# Patient Record
Sex: Female | Born: 1980 | Race: Black or African American | Hispanic: No | Marital: Single | Smoking: Never smoker
Health system: Southern US, Community
[De-identification: ages and names within clinical notes are randomized; demographics above are authoritative.]

## PROBLEM LIST (undated history)

## (undated) DIAGNOSIS — G43909 Migraine, unspecified, not intractable, without status migrainosus: Secondary | ICD-10-CM

## (undated) DIAGNOSIS — J302 Other seasonal allergic rhinitis: Secondary | ICD-10-CM

## (undated) DIAGNOSIS — B029 Zoster without complications: Secondary | ICD-10-CM

## (undated) HISTORY — PX: TUBAL LIGATION: SHX77

---

## 2005-05-10 ENCOUNTER — Emergency Department (HOSPITAL_COMMUNITY): Admission: EM | Admit: 2005-05-10 | Discharge: 2005-05-10 | Payer: Self-pay | Admitting: Emergency Medicine

## 2005-11-02 ENCOUNTER — Emergency Department (HOSPITAL_COMMUNITY): Admission: EM | Admit: 2005-11-02 | Discharge: 2005-11-02 | Payer: Self-pay | Admitting: Family Medicine

## 2006-01-06 ENCOUNTER — Emergency Department (HOSPITAL_COMMUNITY): Admission: EM | Admit: 2006-01-06 | Discharge: 2006-01-06 | Payer: Self-pay | Admitting: Family Medicine

## 2007-04-22 ENCOUNTER — Emergency Department (HOSPITAL_COMMUNITY): Admission: EM | Admit: 2007-04-22 | Discharge: 2007-04-22 | Payer: Self-pay | Admitting: Emergency Medicine

## 2007-05-03 ENCOUNTER — Emergency Department (HOSPITAL_COMMUNITY): Admission: EM | Admit: 2007-05-03 | Discharge: 2007-05-03 | Payer: Self-pay | Admitting: *Deleted

## 2007-05-14 ENCOUNTER — Emergency Department (HOSPITAL_COMMUNITY): Admission: EM | Admit: 2007-05-14 | Discharge: 2007-05-15 | Payer: Self-pay | Admitting: Emergency Medicine

## 2007-05-20 ENCOUNTER — Inpatient Hospital Stay (HOSPITAL_COMMUNITY): Admission: AD | Admit: 2007-05-20 | Discharge: 2007-05-20 | Payer: Self-pay | Admitting: Gynecology

## 2007-05-23 ENCOUNTER — Inpatient Hospital Stay (HOSPITAL_COMMUNITY): Admission: AD | Admit: 2007-05-23 | Discharge: 2007-05-23 | Payer: Self-pay | Admitting: Gynecology

## 2007-05-27 ENCOUNTER — Inpatient Hospital Stay (HOSPITAL_COMMUNITY): Admission: AD | Admit: 2007-05-27 | Discharge: 2007-05-27 | Payer: Self-pay | Admitting: Gynecology

## 2007-06-06 ENCOUNTER — Observation Stay (HOSPITAL_COMMUNITY): Admission: AD | Admit: 2007-06-06 | Discharge: 2007-06-07 | Payer: Self-pay | Admitting: Obstetrics and Gynecology

## 2007-06-06 ENCOUNTER — Ambulatory Visit: Payer: Self-pay | Admitting: Obstetrics and Gynecology

## 2007-06-24 ENCOUNTER — Ambulatory Visit (HOSPITAL_COMMUNITY): Admission: RE | Admit: 2007-06-24 | Discharge: 2007-06-24 | Payer: Self-pay | Admitting: Family Medicine

## 2007-07-01 ENCOUNTER — Ambulatory Visit: Payer: Self-pay | Admitting: Family Medicine

## 2007-07-15 ENCOUNTER — Ambulatory Visit: Payer: Self-pay | Admitting: Obstetrics and Gynecology

## 2007-07-28 ENCOUNTER — Ambulatory Visit: Payer: Self-pay | Admitting: Obstetrics & Gynecology

## 2007-07-28 ENCOUNTER — Ambulatory Visit (HOSPITAL_COMMUNITY): Admission: RE | Admit: 2007-07-28 | Discharge: 2007-07-28 | Payer: Self-pay | Admitting: Obstetrics and Gynecology

## 2007-08-20 ENCOUNTER — Ambulatory Visit: Payer: Self-pay | Admitting: Obstetrics & Gynecology

## 2007-08-20 ENCOUNTER — Inpatient Hospital Stay (HOSPITAL_COMMUNITY): Admission: AD | Admit: 2007-08-20 | Discharge: 2007-08-20 | Payer: Self-pay | Admitting: Obstetrics & Gynecology

## 2007-08-25 ENCOUNTER — Ambulatory Visit: Payer: Self-pay | Admitting: *Deleted

## 2007-09-06 ENCOUNTER — Inpatient Hospital Stay (HOSPITAL_COMMUNITY): Admission: AD | Admit: 2007-09-06 | Discharge: 2007-09-06 | Payer: Self-pay | Admitting: Obstetrics & Gynecology

## 2007-09-06 ENCOUNTER — Ambulatory Visit: Payer: Self-pay | Admitting: Obstetrics and Gynecology

## 2007-09-15 ENCOUNTER — Ambulatory Visit: Payer: Self-pay | Admitting: Obstetrics & Gynecology

## 2007-09-29 ENCOUNTER — Ambulatory Visit: Payer: Self-pay | Admitting: *Deleted

## 2007-09-30 ENCOUNTER — Ambulatory Visit (HOSPITAL_COMMUNITY): Admission: RE | Admit: 2007-09-30 | Discharge: 2007-09-30 | Payer: Self-pay | Admitting: Family Medicine

## 2007-10-13 ENCOUNTER — Ambulatory Visit: Payer: Self-pay | Admitting: Obstetrics & Gynecology

## 2007-10-26 ENCOUNTER — Ambulatory Visit: Payer: Self-pay | Admitting: *Deleted

## 2007-10-26 ENCOUNTER — Inpatient Hospital Stay (HOSPITAL_COMMUNITY): Admission: AD | Admit: 2007-10-26 | Discharge: 2007-10-26 | Payer: Self-pay | Admitting: Obstetrics & Gynecology

## 2007-10-27 ENCOUNTER — Ambulatory Visit: Payer: Self-pay | Admitting: *Deleted

## 2007-11-03 ENCOUNTER — Ambulatory Visit: Payer: Self-pay | Admitting: Obstetrics & Gynecology

## 2007-11-08 ENCOUNTER — Ambulatory Visit: Payer: Self-pay | Admitting: Obstetrics & Gynecology

## 2007-11-08 ENCOUNTER — Inpatient Hospital Stay (HOSPITAL_COMMUNITY): Admission: AD | Admit: 2007-11-08 | Discharge: 2007-11-08 | Payer: Self-pay | Admitting: Obstetrics & Gynecology

## 2007-11-10 ENCOUNTER — Ambulatory Visit: Payer: Self-pay | Admitting: *Deleted

## 2007-11-14 ENCOUNTER — Ambulatory Visit: Payer: Self-pay | Admitting: *Deleted

## 2007-11-14 ENCOUNTER — Inpatient Hospital Stay (HOSPITAL_COMMUNITY): Admission: AD | Admit: 2007-11-14 | Discharge: 2007-11-14 | Payer: Self-pay | Admitting: Gynecology

## 2007-11-17 ENCOUNTER — Ambulatory Visit: Payer: Self-pay | Admitting: Obstetrics & Gynecology

## 2007-11-17 ENCOUNTER — Other Ambulatory Visit: Payer: Self-pay | Admitting: Obstetrics & Gynecology

## 2007-11-20 ENCOUNTER — Ambulatory Visit: Payer: Self-pay | Admitting: Obstetrics and Gynecology

## 2007-11-20 ENCOUNTER — Inpatient Hospital Stay (HOSPITAL_COMMUNITY): Admission: AD | Admit: 2007-11-20 | Discharge: 2007-11-20 | Payer: Self-pay | Admitting: Obstetrics and Gynecology

## 2007-11-22 ENCOUNTER — Ambulatory Visit: Payer: Self-pay | Admitting: Obstetrics & Gynecology

## 2007-11-24 ENCOUNTER — Inpatient Hospital Stay (HOSPITAL_COMMUNITY): Admission: AD | Admit: 2007-11-24 | Discharge: 2007-11-24 | Payer: Self-pay | Admitting: Obstetrics and Gynecology

## 2007-11-26 ENCOUNTER — Ambulatory Visit: Payer: Self-pay | Admitting: Obstetrics and Gynecology

## 2007-11-26 ENCOUNTER — Inpatient Hospital Stay (HOSPITAL_COMMUNITY): Admission: AD | Admit: 2007-11-26 | Discharge: 2007-11-28 | Payer: Self-pay | Admitting: Obstetrics and Gynecology

## 2007-11-27 ENCOUNTER — Encounter (INDEPENDENT_AMBULATORY_CARE_PROVIDER_SITE_OTHER): Payer: Self-pay | Admitting: Gynecology

## 2009-01-14 ENCOUNTER — Emergency Department (HOSPITAL_COMMUNITY): Admission: EM | Admit: 2009-01-14 | Discharge: 2009-01-14 | Payer: Self-pay | Admitting: Emergency Medicine

## 2009-04-15 ENCOUNTER — Emergency Department (HOSPITAL_COMMUNITY): Admission: EM | Admit: 2009-04-15 | Discharge: 2009-04-15 | Payer: Self-pay | Admitting: Emergency Medicine

## 2011-03-04 LAB — URINALYSIS, ROUTINE W REFLEX MICROSCOPIC: Urobilinogen, UA: 1 mg/dL (ref 0.0–1.0)

## 2011-03-04 LAB — POCT PREGNANCY, URINE: Preg Test, Ur: NEGATIVE

## 2011-04-08 NOTE — H&P (Signed)
NAME:  Patricia [name], RIDER NO.:  [id number]   MEDICAL RECORD NO.:  [id number]          PATIENT TYPE:  OBV   LOCATION:  9309                          FACILITY:  [facility]   PHYSICIAN:  Tilda Burrow, M.D. DATE OF BIRTH:  [birth year]   DATE OF ADMISSION:  06/06/2007  DATE OF DISCHARGE:                              HISTORY & PHYSICAL   CHIEF COMPLAINT:  Hyperemesis gravidarum, 15+ pound weight loss this  pregnancy.  Mild dehydration, less than 5%, pregnancy 13 weeks.   ADMITTING HISTORY:  This 30 year old female, gravida 1, para 0 is seen  in the MAU for the 6th time this pregnancy for hyperemesis symptoms.  See _Em-Stat charting_ for details sent.  Essentially she is in here for  dehydration and vomiting again and our plan is to keep her overnight,  vigorously hydrate her with fluids through the night and to coordinate  with home care to see if we can give IV fluid hydration at home in order  to avoid these recurrent visits.   REVIEW OF SYSTEMS:  Significant for the weight is now down to 110 from  126 at the onset of pregnancy.   Our plan is to admit and weigh daily, add Robinul to her regimen for the  excess salivation, Zofran for the nausea, and fluid hydration and low  residue diet with Reglan and Robinul to try to control hyperemesis.  Follow up will be as an outpatient after triage with home health in a.m.      Tilda Burrow, M.D.  Electronically Signed     JVF/MEDQ  D:  06/07/2007  T:  06/07/2007  Job:  [id number]

## 2011-04-08 NOTE — Discharge Summary (Signed)
[name], [name] NO.:  [id number]   MEDICAL RECORD NO.:  [id number]          PATIENT TYPE:  OBV   LOCATION:  9309                          FACILITY:  [facility]   PHYSICIAN:  Tilda Burrow, M.D. DATE OF BIRTH:  [birth year]   DATE OF ADMISSION:  06/06/2007  DATE OF DISCHARGE:  06/07/2007                               DISCHARGE SUMMARY   ADMISSION DIAGNOSES:  1. Hyperemesis gravidarum.  2. Pregnancy 13 weeks' gestation.  3. Mild dehydration, less than 5%.   DISCHARGE DIAGNOSES:  1. Pregnancy, 13 weeks, not delivered.  2. Hyperemesis gravidarum.  3. Dehydration, resolved.   DISCHARGE MEDICATIONS:  1. Zofran 8 mg p.o. q.12h. p.r.n. nausea.  2. Reglan 10 mg p.o. a.c.  3. Robinul 2 mg daily for salivation.   Low-residue diet.   FOLLOW-UP:  Home health.   HOSPITAL SUMMARY:  This 30 year old primiparous female was admitted for  fluid hydration after the sixth evaluation in this pregnancy for  hyperemesis symptoms.  See MAU notes.  The plan was that she was  admitted on the 13th for fluid hydration.   HOSPITAL COURSE:  The patient was admitted and received vigorous fluid  hydration and antiemetics, Robinul to reduce salivation, and was kept  overnight for fluid hydration and received a couple of units of fluid.  She was then subsequently connected to home health, who is going to give  her a liter of lactated Ringer's at 100 mL/hr. And Zofran 12 mg added to  bag every other day with Hep-Lock cuff.  The patient was to have this  therapy for approximately 2 weeks and with discontinuation of the IV  therapy thereafter.  Follow-up will subsequently be through the health  department for subsequent prenatal care.      Tilda Burrow, M.D.  Electronically Signed    JVF/MEDQ  D:  07/20/2007  T:  07/21/2007  Job:  [id number]   cc:   [facility] Department

## 2011-04-08 NOTE — Op Note (Signed)
NAME:  ROSALEA, Patricia [name] NO.:  [id number]   MEDICAL RECORD NO.:  [id number]           PATIENT TYPE:   LOCATION:                                 FACILITY:   PHYSICIAN:  Ginger Carne, MD  DATE OF BIRTH:  [birth year]   DATE OF PROCEDURE:  11/27/2007  DATE OF DISCHARGE:                               OPERATIVE REPORT   PREOPERATIVE DIAGNOSIS:  Postpartum sterilization.   POSTOPERATIVE DIAGNOSIS:  Postpartum sterilization.   PROCEDURE:  Pomeroy bilateral tubal ligation, postpartum.   SURGEON:  Ginger Carne, M.D.   ASSISTANT:  None.   COMPLICATIONS:  None immediate.   ESTIMATED BLOOD LOSS:  Minimal.   SPECIMEN:  Portions of right and left tubes to Pathology.   OPERATIVE FINDINGS:  Uterus, tubes and ovaries showed normal decidual  changes of pregnancy.  Both tubes were identified from their isthmus to  fimbriated ends separate and apart from their respective round ligaments  on either side.   OPERATIVE PROCEDURE:  The patient was prepped and draped in the usual  fashion and placed in the supine position.  Betadine solution was used  for antiseptic, and the patient voided immediately prior to the OR.  A  vertical infraumbilical incision was made and the abdomen opened.  Either tube was grasped at the isthmus/ampullary junction.  Three cm of  tube were incorporated between 2-0 plain catgut suture twice.  The tubes  were severed above said knots.  The specimens sent separately to  Pathology.  Tips cauterized.  No active bleeding noted.  Zero Vicryl  running for the fascia and 4-0 Monocryl for a subcuticular closures.  Instrument and sponge count were correct.  The patient tolerated the  procedure well and returned to the postanesthesia recovery room in  excellent condition.      Ginger Carne, MD  Electronically Signed     SHB/MEDQ  D:  11/27/2007  T:  11/27/2007  Job:  [id number]

## 2011-08-14 LAB — CBC
HCT: 32.2 — ABNORMAL LOW
HCT: 33.5 — ABNORMAL LOW
MCHC: 34.7
MCV: 90.8
MCV: 92
Platelets: 169
RDW: 12.8

## 2011-08-29 LAB — POCT URINALYSIS DIP (DEVICE)
Protein, ur: 30 — AB
Protein, ur: NEGATIVE
Specific Gravity, Urine: 1.01
Urobilinogen, UA: 1
Urobilinogen, UA: 2 — ABNORMAL HIGH

## 2011-08-29 LAB — URINALYSIS, ROUTINE W REFLEX MICROSCOPIC
Bilirubin Urine: NEGATIVE
Bilirubin Urine: NEGATIVE
Bilirubin Urine: NEGATIVE
Glucose, UA: NEGATIVE
Glucose, UA: NEGATIVE
Glucose, UA: NEGATIVE
Hgb urine dipstick: NEGATIVE
Hgb urine dipstick: NEGATIVE
Hgb urine dipstick: NEGATIVE
Ketones, ur: NEGATIVE
Ketones, ur: NEGATIVE
Ketones, ur: NEGATIVE
Nitrite: NEGATIVE
Protein, ur: NEGATIVE
Protein, ur: NEGATIVE
Specific Gravity, Urine: <1.005 — ABNORMAL LOW
Urobilinogen, UA: 0.2
Urobilinogen, UA: 1
Urobilinogen, UA: 2 — ABNORMAL HIGH
Urobilinogen, UA: 2 — ABNORMAL HIGH
pH: 6.5
pH: 7

## 2011-08-29 LAB — URINE MICROSCOPIC-ADD ON

## 2011-09-01 LAB — POCT URINALYSIS DIP (DEVICE)
Glucose, UA: NEGATIVE
Hgb urine dipstick: NEGATIVE
Ketones, ur: NEGATIVE
Nitrite: NEGATIVE
Operator id: 159681
Protein, ur: NEGATIVE
Protein, ur: NEGATIVE
Specific Gravity, Urine: 1.02
Urobilinogen, UA: 0.2
pH: 7

## 2011-09-01 LAB — FETAL FIBRONECTIN: Fetal Fibronectin: NEGATIVE

## 2011-09-01 LAB — URINALYSIS, ROUTINE W REFLEX MICROSCOPIC
Hgb urine dipstick: NEGATIVE
Ketones, ur: NEGATIVE
Nitrite: NEGATIVE
Protein, ur: NEGATIVE

## 2011-09-01 LAB — WET PREP, GENITAL
Trich, Wet Prep: NONE SEEN
Yeast Wet Prep HPF POC: NONE SEEN

## 2011-09-02 LAB — POCT URINALYSIS DIP (DEVICE)
Glucose, UA: NEGATIVE
Hgb urine dipstick: NEGATIVE
Hgb urine dipstick: NEGATIVE
Ketones, ur: NEGATIVE
Nitrite: NEGATIVE
Protein, ur: NEGATIVE
Specific Gravity, Urine: 1.015
Specific Gravity, Urine: 1.015
Urobilinogen, UA: 0.2

## 2011-09-03 LAB — POCT URINALYSIS DIP (DEVICE)
Hgb urine dipstick: NEGATIVE
Ketones, ur: NEGATIVE
Protein, ur: NEGATIVE
Specific Gravity, Urine: 1.01
Urobilinogen, UA: 1

## 2011-09-04 LAB — POCT URINALYSIS DIP (DEVICE)
Glucose, UA: NEGATIVE
Hgb urine dipstick: NEGATIVE
Ketones, ur: NEGATIVE
Operator id: 120861
Protein, ur: NEGATIVE
Specific Gravity, Urine: 1.02
Urobilinogen, UA: >=8

## 2011-09-04 LAB — URINALYSIS, ROUTINE W REFLEX MICROSCOPIC
Bilirubin Urine: NEGATIVE
Glucose, UA: NEGATIVE
Glucose, UA: NEGATIVE
Hgb urine dipstick: NEGATIVE
Hgb urine dipstick: NEGATIVE
Ketones, ur: NEGATIVE
Protein, ur: NEGATIVE
Protein, ur: NEGATIVE
Urobilinogen, UA: 1
pH: 7

## 2011-09-04 LAB — GC/CHLAMYDIA PROBE AMP, GENITAL: GC Probe Amp, Genital: NEGATIVE

## 2011-09-04 LAB — WET PREP, GENITAL
Trich, Wet Prep: NONE SEEN
Yeast Wet Prep HPF POC: NONE SEEN

## 2011-09-05 LAB — POCT URINALYSIS DIP (DEVICE)
Glucose, UA: NEGATIVE
Nitrite: NEGATIVE
Operator id: 297281
Specific Gravity, Urine: 1.025
Urobilinogen, UA: 1

## 2011-09-08 LAB — POCT URINALYSIS DIP (DEVICE)
Glucose, UA: NEGATIVE
Nitrite: NEGATIVE
Operator id: 120861
Protein, ur: NEGATIVE
Urobilinogen, UA: 2 — ABNORMAL HIGH

## 2011-09-09 LAB — URINALYSIS, ROUTINE W REFLEX MICROSCOPIC
Glucose, UA: NEGATIVE
Glucose, UA: NEGATIVE
Hgb urine dipstick: NEGATIVE
Ketones, ur: >80 — AB
Nitrite: NEGATIVE
Protein, ur: NEGATIVE
Protein, ur: NEGATIVE
Urobilinogen, UA: 1

## 2011-09-09 LAB — CBC
HCT: 41.8
MCHC: 33.7
MCV: 89.4
Platelets: 266
WBC: 6.1

## 2011-09-09 LAB — COMPREHENSIVE METABOLIC PANEL
AST: 18
Albumin: 3.9
Alkaline Phosphatase: 35 — ABNORMAL LOW
BUN: 7
Chloride: 101
Potassium: 3.2 — ABNORMAL LOW
Total Bilirubin: 1

## 2011-09-09 LAB — SAMPLE TO BLOOD BANK

## 2011-09-09 LAB — POCT PREGNANCY, URINE: Preg Test, Ur: POSITIVE

## 2011-09-10 LAB — URINE MICROSCOPIC-ADD ON

## 2011-09-10 LAB — URINALYSIS, ROUTINE W REFLEX MICROSCOPIC
Bilirubin Urine: NEGATIVE
Glucose, UA: NEGATIVE
Glucose, UA: NEGATIVE
Glucose, UA: NEGATIVE
Ketones, ur: >80 — AB
Ketones, ur: >80 — AB
Leukocytes, UA: NEGATIVE
Nitrite: NEGATIVE
Nitrite: NEGATIVE
Protein, ur: 30 — AB
Specific Gravity, Urine: >1.03 — ABNORMAL HIGH
pH: 6
pH: 6
pH: 6.5

## 2011-09-10 LAB — URINE CULTURE
Colony Count: >=100000
Colony Count: >=100000

## 2011-09-10 LAB — CBC
HCT: 35.6 — ABNORMAL LOW
MCHC: 33.8
Platelets: 229
RDW: 13

## 2011-09-10 LAB — I-STAT 8, (EC8 V) (CONVERTED LAB)
BUN: 9
Chloride: 105
pCO2, Ven: 39.8 — ABNORMAL LOW
pH, Ven: 7.359 — ABNORMAL HIGH

## 2011-09-11 LAB — I-STAT 8, (EC8 V) (CONVERTED LAB)
BUN: 6
Bicarbonate: 22
Glucose, Bld: 84
Hemoglobin: 14.6
Operator id: 285841
Sodium: 136
TCO2: 23
pCO2, Ven: 36.5 — ABNORMAL LOW

## 2011-09-11 LAB — CBC
Hemoglobin: 13.6
MCHC: 33.4
Platelets: 271
RDW: 12.3

## 2011-09-11 LAB — POCT I-STAT CREATININE: Operator id: 285841

## 2011-09-11 LAB — DIFFERENTIAL
Basophils Absolute: 0
Basophils Relative: 1
Eosinophils Relative: 1
Lymphocytes Relative: 30
Monocytes Absolute: 0.6
Neutro Abs: 3

## 2011-09-11 LAB — URINE CULTURE: Colony Count: >=100000

## 2011-09-11 LAB — URINALYSIS, ROUTINE W REFLEX MICROSCOPIC
Hgb urine dipstick: NEGATIVE
Ketones, ur: >80 — AB
Nitrite: POSITIVE — AB
Protein, ur: 30 — AB
Urobilinogen, UA: 1

## 2011-09-21 ENCOUNTER — Emergency Department (HOSPITAL_COMMUNITY)
Admission: EM | Admit: 2011-09-21 | Discharge: 2011-09-21 | Disposition: A | Discharge status: Home / Self Care | Payer: Self-pay | Attending: Emergency Medicine | Admitting: Emergency Medicine

## 2011-09-21 ENCOUNTER — Emergency Department (HOSPITAL_COMMUNITY): Payer: Self-pay

## 2011-09-21 DIAGNOSIS — F17200 Nicotine dependence, unspecified, uncomplicated: Secondary | ICD-10-CM | POA: Insufficient documentation

## 2011-09-21 DIAGNOSIS — S6390XA Sprain of unspecified part of unspecified wrist and hand, initial encounter: Secondary | ICD-10-CM | POA: Insufficient documentation

## 2011-09-21 DIAGNOSIS — Y998 Other external cause status: Secondary | ICD-10-CM | POA: Insufficient documentation

## 2011-09-21 DIAGNOSIS — Y92009 Unspecified place in unspecified non-institutional (private) residence as the place of occurrence of the external cause: Secondary | ICD-10-CM | POA: Insufficient documentation

## 2012-03-17 ENCOUNTER — Encounter (HOSPITAL_COMMUNITY): Payer: Self-pay | Admitting: Emergency Medicine

## 2012-03-17 ENCOUNTER — Emergency Department (HOSPITAL_COMMUNITY)
Admission: EM | Admit: 2012-03-17 | Discharge: 2012-03-17 | Disposition: A | Discharge status: Home / Self Care | Payer: Self-pay | Attending: Emergency Medicine | Admitting: Emergency Medicine

## 2012-03-17 DIAGNOSIS — J309 Allergic rhinitis, unspecified: Secondary | ICD-10-CM | POA: Insufficient documentation

## 2012-03-17 DIAGNOSIS — R112 Nausea with vomiting, unspecified: Secondary | ICD-10-CM | POA: Insufficient documentation

## 2012-03-17 DIAGNOSIS — J302 Other seasonal allergic rhinitis: Secondary | ICD-10-CM

## 2012-03-17 DIAGNOSIS — E860 Dehydration: Secondary | ICD-10-CM | POA: Insufficient documentation

## 2012-03-17 DIAGNOSIS — I10 Essential (primary) hypertension: Secondary | ICD-10-CM | POA: Insufficient documentation

## 2012-03-17 LAB — POCT I-STAT, CHEM 8
BUN: <3 mg/dL — ABNORMAL LOW (ref 6–23)
Calcium, Ion: 1.25 mmol/L (ref 1.12–1.32)
Chloride: 103 mEq/L (ref 96–112)
Creatinine, Ser: 0.8 mg/dL (ref 0.50–1.10)
Glucose, Bld: 106 mg/dL — ABNORMAL HIGH (ref 70–99)
Potassium: 4 mEq/L (ref 3.5–5.1)

## 2012-03-17 MED ORDER — SODIUM CHLORIDE 0.9 % IV SOLN
INTRAVENOUS | Status: DC
Start: 2012-03-17 — End: 2012-03-17

## 2012-03-17 MED ORDER — ONDANSETRON HCL 4 MG/2ML IJ SOLN
4.0000 mg | Freq: Once | INTRAMUSCULAR | Status: AC
  Administered 2012-03-17: 4 mg via INTRAVENOUS
  Filled 2012-03-17: qty 2

## 2012-03-17 MED ORDER — PROMETHAZINE HCL 25 MG RE SUPP: 25.0000 mg | Freq: Four times a day (QID) | RECTAL | Status: DC | PRN

## 2012-03-17 MED ORDER — SODIUM CHLORIDE 0.9 % IV BOLUS (SEPSIS)
1500.0000 mL | INTRAVENOUS | Status: AC
  Administered 2012-03-17: 1500 mL via INTRAVENOUS

## 2012-03-17 NOTE — Discharge Instructions (Signed)
Drink plenty of fluids (clear liquids) the next 12-24 hours then start the BRAT diet. . Use the phenergan for nausea or vomiting. Take clariting or zyrtec OTC for your runny nose and eyes. Recheck if you get worse again or you get a fever.

## 2012-03-17 NOTE — ED Provider Notes (Signed)
History     CSN: [id number]  Arrival date & time 03/17/12  0255   First MD Initiated Contact with Patient 03/17/12 [phone]      Chief Complaint  Patient presents with  . 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 She states that she has to cough up and down up and the environmental temperature is 69. She states on the 22nd she started having rhinorrhea, sneezing, watering of her eyes, and tingling in her throat. She started having nausea and vomiting yesterday and having cold chills and needles in her throat. She denies cough or diarrhea. She states she's vomited about 6 times.  PCP Dr. Elmer Picker  History reviewed. No pertinent past medical history.  History reviewed. No pertinent past surgical history.  Family History  Problem Relation Age of Onset  . Hypertension Other     History  Substance Use Topics  . Smoking status: Never Smoker   . Smokeless tobacco: Not on file  . Alcohol Use: occassional wine  employed  OB History    Grav Para Term Preterm Abortions TAB SAB Ect Mult Living                  Review of Systems  All other systems reviewed and are negative.    Allergies  Hydrocodone  Home Medications  None   BP 122/82  Pulse 110  Temp(Src) 100.5 F (38.1 C) (Oral)  Resp 20  SpO2 100%  LMP 03/08/2012  Vital signs normal except tachycardia, low grade temperature   Physical Exam  Nursing note and vitals reviewed. Constitutional: She is oriented to person, place, and time. She appears well-developed and well-nourished.  Non-toxic appearance. She does not appear ill. No distress.  HENT:  Head: Normocephalic and atraumatic.  Right Ear: External ear normal.  Left Ear: External ear normal.  Nose: Nose normal. No mucosal edema or rhinorrhea.  Mouth/Throat: Oropharynx is clear and moist and mucous  membranes are normal. No dental abscesses or uvula swelling.       Patient has clear rhinorrhea  Eyes: Conjunctivae and EOM are normal. Pupils are equal, round, and reactive to light.       Clear tearing noted  Neck: Normal range of motion and full passive range of motion without pain. Neck supple.  Cardiovascular: Normal rate, regular rhythm and normal heart sounds.  Exam reveals no gallop and no friction rub.   No murmur heard. Pulmonary/Chest: Effort normal and breath sounds normal. No respiratory distress. She has no wheezes. She has no rhonchi. She has no rales. She exhibits no tenderness and no crepitus.  Abdominal: Soft. Normal appearance and bowel sounds are normal. She exhibits no distension. There is no tenderness. There is no rebound and no guarding.  Musculoskeletal: Normal range of motion. She exhibits no edema and no tenderness.       Moves all extremities well.   Neurological: She is alert and oriented to person, place, and time. She has normal strength. No cranial nerve deficit.  Skin: Skin is warm, dry and intact. No rash noted. No erythema. No pallor.  Psychiatric: She has a normal mood and affect. Her speech is normal and behavior is normal. Her mood appears not anxious.    ED Course  Procedures (including critical care time)    Medications  0.9 %  sodium chloride infusion (  Intravenous Rate/Dose Change 03/17/12 0606)  promethazine (  PHENERGAN) 25 MG suppository (not administered)  sodium chloride 0.9 % bolus 1,500 mL (1500 mL Intravenous Given 03/17/12 0431)  ondansetron (ZOFRAN) injection 4 mg (4 mg Intravenous Given 03/17/12 0431)   Pt had one episode of vomiting after IV started.  Recheck at 06:50 and was able to drink fluids and feels better.   Results for orders placed during the hospital encounter of 03/17/12  POCT I-STAT, CHEM 8      Component Value Range   Sodium 138  135 - 145 (mEq/L)   Potassium 4.0  3.5 - 5.1 (mEq/L)   Chloride 103  96 - 112 (mEq/L)    BUN <3 (*) 6 - 23 (mg/dL)   Creatinine, Ser 1.61  0.50 - 1.10 (mg/dL)   Glucose, Bld 096 (*) 70 - 99 (mg/dL)   Calcium, Ion 0.45  4.09 - 1.32 (mmol/L)   TCO2 23  0 - 100 (mmol/L)   Hemoglobin 15.0  12.0 - 15.0 (g/dL)   HCT 81.1  91.4 - 78.2 (%)   Laboratory interpretation all normal except      1. Nausea and vomiting   2. Dehydration   3. Seasonal allergies     New Prescriptions   PROMETHAZINE (PHENERGAN) 25 MG SUPPOSITORY    Place 1 suppository (25 mg total) rectally every 6 (six) hours as needed for nausea.   Plan discharge  Devoria Albe, MD, FACEP   MDM          Ward Givens, MD 03/17/12 [phone]

## 2012-03-17 NOTE — ED Notes (Signed)
Pt states she has been having chills and runny nose since yesterday  Pt states she started vomiting today along with body aches  Pt states whenever she tries to drink anything it comes back up

## 2013-11-24 ENCOUNTER — Emergency Department (HOSPITAL_BASED_OUTPATIENT_CLINIC_OR_DEPARTMENT_OTHER): Payer: Medicaid Other

## 2013-11-24 ENCOUNTER — Emergency Department (HOSPITAL_BASED_OUTPATIENT_CLINIC_OR_DEPARTMENT_OTHER)
Admission: EM | Admit: 2013-11-24 | Discharge: 2013-11-24 | Disposition: A | Discharge status: Home / Self Care | Payer: Medicaid Other | Attending: Emergency Medicine | Admitting: Emergency Medicine

## 2013-11-24 ENCOUNTER — Encounter (HOSPITAL_BASED_OUTPATIENT_CLINIC_OR_DEPARTMENT_OTHER): Payer: Self-pay | Admitting: Emergency Medicine

## 2013-11-24 DIAGNOSIS — M545 Low back pain, unspecified: Secondary | ICD-10-CM | POA: Insufficient documentation

## 2013-11-24 DIAGNOSIS — M549 Dorsalgia, unspecified: Secondary | ICD-10-CM

## 2013-11-24 DIAGNOSIS — Z3202 Encounter for pregnancy test, result negative: Secondary | ICD-10-CM | POA: Insufficient documentation

## 2013-11-24 LAB — URINALYSIS, ROUTINE W REFLEX MICROSCOPIC
Bilirubin Urine: NEGATIVE
Glucose, UA: NEGATIVE mg/dL
Ketones, ur: NEGATIVE mg/dL
Leukocytes, UA: NEGATIVE
Nitrite: NEGATIVE
Protein, ur: NEGATIVE mg/dL
SPECIFIC GRAVITY, URINE: 1.007 (ref 1.005–1.030)
Urobilinogen, UA: 0.2 mg/dL (ref 0.0–1.0)
pH: 6 (ref 5.0–8.0)

## 2013-11-24 LAB — URINE MICROSCOPIC-ADD ON

## 2013-11-24 LAB — PREGNANCY, URINE: Preg Test, Ur: NEGATIVE

## 2013-11-24 MED ORDER — IBUPROFEN 600 MG PO TABS: 600.0000 mg | ORAL_TABLET | Freq: Four times a day (QID) | ORAL | Status: DC | PRN

## 2013-11-24 MED ORDER — TRAMADOL HCL 50 MG PO TABS
50.0000 mg | ORAL_TABLET | Freq: Four times a day (QID) | ORAL | Status: DC | PRN
Start: 2013-11-24 — End: 2015-10-31

## 2013-11-24 NOTE — ED Notes (Signed)
Back pain x 2 months

## 2013-11-24 NOTE — ED Provider Notes (Signed)
CSN: [id number]     Arrival date & time 11/24/13  1703 History   This chart was scribed for Rolan Bucco, MD by Blanchard Kelch, ED Scribe. The patient was seen in room MH04/MH04. Patient's care was started at 8:58 PM.    Chief Complaint  Patient presents with  . Back Pain    Patient is a 33 y.o. female presenting with back pain. The history is provided by the patient. No language interpreter was used.  Back Pain Associated symptoms: no abdominal pain, no chest pain, no fever, no headaches, no numbness and no weakness     HPI Comments: Sondra L Glendell Docker is a 33 y.o. female who presents to the Emergency Department complaining of constant, mid-centered back pain that began about two months ago. The pain is worsened by bending down. She states that yesterday the pain in her back started feeling like a burning sensation. She has been taking Motrin and sleeping with a heating pad with mild relief. She states that she was playing with her children prior to the pain starting so she believes she may have strained something. She also states that she drinks tea often and so she was concerned about problems with her kidneys. She denies the pain radiates down her legs, numbness, nausea, vomiting, fever, abdominal pain, dysuria or frequency.  History reviewed. No pertinent past medical history. History reviewed. No pertinent past surgical history. Family History  Problem Relation Age of Onset  . Hypertension Other    History  Substance Use Topics  . Smoking status: Never Smoker   . Smokeless tobacco: Not on file  . Alcohol Use: No   OB History   Grav Para Term Preterm Abortions TAB SAB Ect Mult Living                 Review of Systems  Constitutional: Negative for fever, chills, diaphoresis and fatigue.  HENT: Negative for congestion, rhinorrhea and sneezing.   Eyes: Negative.   Respiratory: Negative for cough, chest tightness and shortness of breath.   Cardiovascular: Negative for chest pain  and leg swelling.  Gastrointestinal: Negative for nausea, vomiting, abdominal pain, diarrhea and blood in stool.  Genitourinary: Negative for frequency, hematuria, flank pain and difficulty urinating.  Musculoskeletal: Positive for back pain. Negative for arthralgias and neck pain.  Skin: Negative for rash and wound.  Neurological: Negative for dizziness, speech difficulty, weakness, numbness and headaches.    Allergies  Hydrocodone  Home Medications   Current Outpatient Rx  Name  Route  Sig  Dispense  Refill  . ibuprofen (ADVIL,MOTRIN) 600 MG tablet   Oral   Take 1 tablet (600 mg total) by mouth every 6 (six) hours as needed.   30 tablet   0   . EXPIRED: promethazine (PHENERGAN) 25 MG suppository   Rectal   Place 1 suppository (25 mg total) rectally every 6 (six) hours as needed for nausea.   6 each   0   . traMADol (ULTRAM) 50 MG tablet   Oral   Take 1 tablet (50 mg total) by mouth every 6 (six) hours as needed.   15 tablet   0    LMP 11/03/2013 Physical Exam  Constitutional: She is oriented to person, place, and time. She appears well-developed and well-nourished.  HENT:  Head: Normocephalic and atraumatic.  Eyes: Pupils are equal, round, and reactive to light.  Neck: Normal range of motion. Neck supple.  Cardiovascular: Normal rate, regular rhythm and normal heart sounds.   Pulmonary/Chest:  Effort normal and breath sounds normal. No respiratory distress. She has no wheezes. She has no rales. She exhibits no tenderness.  Abdominal: Soft. Bowel sounds are normal. There is no tenderness. There is no rebound and no guarding.  Musculoskeletal: Normal range of motion. She exhibits tenderness. She exhibits no edema.  Tenderness along the L3-L4 area. Negative straight leg raise bilaterally.   Lymphadenopathy:    She has no cervical adenopathy.  Neurological: She is alert and oriented to person, place, and time.  Normal motor function and sensation in the lower  extremities. Patellar reflexes symmetric.   Skin: Skin is warm and dry. No rash noted.  Psychiatric: She has a normal mood and affect.    ED Course  Procedures (including critical care time)    COORDINATION OF CARE: 8:07 PM -Will order lumbar spine x-ray. Patient verbalizes understanding and agrees with treatment plan.    Labs Review Labs Reviewed  URINALYSIS, ROUTINE W REFLEX MICROSCOPIC - Abnormal; Notable for the following:    Hgb urine dipstick TRACE (*)    All other components within normal limits  PREGNANCY, URINE  URINE MICROSCOPIC-ADD ON   Imaging Review Dg Lumbar Spine Complete  11/24/2013   CLINICAL DATA:  Back pain.  EXAM: LUMBAR SPINE - COMPLETE 4+ VIEW  COMPARISON:  None.  FINDINGS: Slight loss of intervertebral disc space at L5-S1 may reflect degenerative disc disease. Minimal endplate sclerosis and possible Schmorl's node along the inferior endplate of L5.  Lumbar vertebral alignment normal.  No fracture identified.  IMPRESSION: 1. Suspected degenerative disc disease at L5-S1, with loss of disc height. There is some mild inferior endplate sclerosis at L5 with a potential Schmorl's node along the inferior endplate. If pain persists despite conservative therapy, MRI may be warranted for further characterization.   Electronically Signed   By: Herbie BaltimoreWalt  Liebkemann M.D.   On: 11/24/2013 20:34    EKG Interpretation   None       MDM   1. Back pain    Patient has no neurologic deficits. There is no suggestions of cauda equina. Her pain has been going on for about 2 months. She does have some abnormalities on her x-ray and I encouraged her to followup with orthopedics. [facility] She's given a prescription for ibuprofen and tramadol for symptomatic relief.  I personally performed the services described in this documentation, which was scribed in my presence.  The recorded information has been reviewed and considered.     Rolan BuccoMelanie  Josph Norfleet, MD 11/24/13 2100

## 2013-11-24 NOTE — Discharge Instructions (Signed)
Back Pain, Adult Low back pain is very common. About 1 in 5 people have back pain.The cause of low back pain is rarely dangerous. The pain often gets better over time.About half of people with a sudden onset of back pain feel better in just 2 weeks. About 8 in 10 people feel better by 6 weeks.  CAUSES Some common causes of back pain include:  Strain of the muscles or ligaments supporting the spine.  Wear and tear (degeneration) of the spinal discs.  Arthritis.  Direct injury to the back. DIAGNOSIS Most of the time, the direct cause of low back pain is not known.However, back pain can be treated effectively even when the exact cause of the pain is unknown.Answering your caregiver's questions about your overall health and symptoms is one of the most accurate ways to make sure the cause of your pain is not dangerous. If your caregiver needs more information, he or she may order lab work or imaging tests (X-rays or MRIs).However, even if imaging tests show changes in your back, this usually does not require surgery. HOME CARE INSTRUCTIONS For many people, back pain returns.Since low back pain is rarely dangerous, it is often a condition that people can learn to manageon their own.   Remain active. It is stressful on the back to sit or stand in one place. Do not sit, drive, or stand in one place for more than 30 minutes at a time. Take short walks on level surfaces as soon as pain allows.Try to increase the length of time you walk each day.  Do not stay in bed.Resting more than 1 or 2 days can delay your recovery.  Do not avoid exercise or work.Your body is made to move.It is not dangerous to be active, even though your back may hurt.Your back will likely heal faster if you return to being active before your pain is gone.  Pay attention to your body when you bend and lift. Many people have less discomfortwhen lifting if they bend their knees, keep the load close to their bodies,and  avoid twisting. Often, the most comfortable positions are those that put less stress on your recovering back.  Find a comfortable position to sleep. Use a firm mattress and lie on your side with your knees slightly bent. If you lie on your back, put a pillow under your knees.  Only take over-the-counter or prescription medicines as directed by your caregiver. Over-the-counter medicines to reduce pain and inflammation are often the most helpful.Your caregiver may prescribe muscle relaxant drugs.These medicines help dull your pain so you can more quickly return to your normal activities and healthy exercise.  Put ice on the injured area.  Put ice in a plastic bag.  Place a towel between your skin and the bag.  Leave the ice on for 15-20 minutes, 03-04 times a day for the first 2 to 3 days. After that, ice and heat may be alternated to reduce pain and spasms.  Ask your caregiver about trying back exercises and gentle massage. This may be of some benefit.  Avoid feeling anxious or stressed.Stress increases muscle tension and can worsen back pain.It is important to recognize when you are anxious or stressed and learn ways to manage it.Exercise is a great option. SEEK MEDICAL CARE IF:  You have pain that is not relieved with rest or medicine.  You have pain that does not improve in 1 week.  You have new symptoms.  You are generally not feeling well. SEEK   IMMEDIATE MEDICAL CARE IF:   You have pain that radiates from your back into your legs.  You develop new bowel or bladder control problems.  You have unusual weakness or numbness in your arms or legs.  You develop nausea or vomiting.  You develop abdominal pain.  You feel faint. Document Released: 11/10/2005 Document Revised: 05/11/2012 Document Reviewed: 03/31/2011 [employer] Patient Information 2014 [employer].  

## 2014-03-11 ENCOUNTER — Encounter (HOSPITAL_BASED_OUTPATIENT_CLINIC_OR_DEPARTMENT_OTHER): Payer: Self-pay | Admitting: Emergency Medicine

## 2014-03-11 ENCOUNTER — Emergency Department (HOSPITAL_BASED_OUTPATIENT_CLINIC_OR_DEPARTMENT_OTHER)
Admission: EM | Admit: 2014-03-11 | Discharge: 2014-03-11 | Disposition: A | Discharge status: Home / Self Care | Payer: Medicaid Other | Attending: Emergency Medicine | Admitting: Emergency Medicine

## 2014-03-11 DIAGNOSIS — IMO0001 Reserved for inherently not codable concepts without codable children: Secondary | ICD-10-CM | POA: Insufficient documentation

## 2014-03-11 DIAGNOSIS — N390 Urinary tract infection, site not specified: Secondary | ICD-10-CM | POA: Insufficient documentation

## 2014-03-11 DIAGNOSIS — Z3202 Encounter for pregnancy test, result negative: Secondary | ICD-10-CM | POA: Insufficient documentation

## 2014-03-11 DIAGNOSIS — R1110 Vomiting, unspecified: Secondary | ICD-10-CM

## 2014-03-11 LAB — COMPREHENSIVE METABOLIC PANEL
ALBUMIN: 4.2 g/dL (ref 3.5–5.2)
ALT: 15 U/L (ref 0–35)
AST: 19 U/L (ref 0–37)
Alkaline Phosphatase: 72 U/L (ref 39–117)
BUN: 9 mg/dL (ref 6–23)
CALCIUM: 8.9 mg/dL (ref 8.4–10.5)
CO2: 26 mEq/L (ref 19–32)
Chloride: 105 mEq/L (ref 96–112)
Creatinine, Ser: 0.7 mg/dL (ref 0.50–1.10)
GFR calc Af Amer: >90 mL/min (ref >90)
GFR calc non Af Amer: >90 mL/min (ref >90)
Glucose, Bld: 99 mg/dL (ref 70–99)
Potassium: 3.9 mEq/L (ref 3.7–5.3)
SODIUM: 143 meq/L (ref 137–147)
Total Bilirubin: 0.3 mg/dL (ref 0.3–1.2)
Total Protein: 7.7 g/dL (ref 6.0–8.3)

## 2014-03-11 LAB — CBC WITH DIFFERENTIAL/PLATELET
BASOS ABS: 0 10*3/uL (ref 0.0–0.1)
BASOS PCT: 0 % (ref 0–1)
EOS PCT: 0 % (ref 0–5)
Eosinophils Absolute: 0 10*3/uL (ref 0.0–0.7)
HEMATOCRIT: 43 % (ref 36.0–46.0)
Hemoglobin: 14.2 g/dL (ref 12.0–15.0)
Lymphocytes Relative: 6 % — ABNORMAL LOW (ref 12–46)
Lymphs Abs: 0.7 10*3/uL (ref 0.7–4.0)
MCH: 30.2 pg (ref 26.0–34.0)
MCHC: 33 g/dL (ref 30.0–36.0)
MCV: 91.5 fL (ref 78.0–100.0)
Monocytes Absolute: 0.6 10*3/uL (ref 0.1–1.0)
Monocytes Relative: 5 % (ref 3–12)
NEUTROS ABS: 10.2 10*3/uL — AB (ref 1.7–7.7)
Neutrophils Relative %: 89 % — ABNORMAL HIGH (ref 43–77)
PLATELETS: 284 10*3/uL (ref 150–400)
RBC: 4.7 MIL/uL (ref 3.87–5.11)
RDW: 12.7 % (ref 11.5–15.5)
WBC: 11.5 10*3/uL — ABNORMAL HIGH (ref 4.0–10.5)

## 2014-03-11 LAB — URINE MICROSCOPIC-ADD ON

## 2014-03-11 LAB — PREGNANCY, URINE: PREG TEST UR: NEGATIVE

## 2014-03-11 LAB — URINALYSIS, ROUTINE W REFLEX MICROSCOPIC
Bilirubin Urine: NEGATIVE
GLUCOSE, UA: NEGATIVE mg/dL
Hgb urine dipstick: NEGATIVE
Ketones, ur: 15 mg/dL — AB
NITRITE: NEGATIVE
PROTEIN: 30 mg/dL — AB
Specific Gravity, Urine: 1.023 (ref 1.005–1.030)
UROBILINOGEN UA: 1 mg/dL (ref 0.0–1.0)
pH: 8 (ref 5.0–8.0)

## 2014-03-11 MED ORDER — ONDANSETRON HCL 4 MG/2ML IJ SOLN
INTRAMUSCULAR | Status: AC
  Administered 2014-03-11: 4 mg
  Filled 2014-03-11: qty 2

## 2014-03-11 MED ORDER — ONDANSETRON 4 MG PO TBDP: 4.0000 mg | ORAL_TABLET | Freq: Three times a day (TID) | ORAL | Status: DC | PRN

## 2014-03-11 MED ORDER — NITROFURANTOIN MONOHYD MACRO 100 MG PO CAPS: 100.0000 mg | ORAL_CAPSULE | Freq: Two times a day (BID) | ORAL | Status: DC

## 2014-03-11 NOTE — ED Provider Notes (Signed)
CSN: [id number]     Arrival date & time 03/11/14  1415 History   First MD Initiated Contact with Patient 03/11/14 1621     Chief Complaint  Patient presents with  . Emesis     (Consider location/radiation/quality/duration/timing/severity/associated sxs/prior Treatment) Patient is a 33 y.o. female presenting with vomiting. The history is provided by the patient. No language interpreter was used.  Emesis Severity:  Moderate Duration:  1 day Timing:  Constant Number of daily episodes:  Multiple Quality:  Undigested food Progression:  Worsening Chronicity:  New Recent urination:  Normal Relieved by:  Nothing Worsened by:  Nothing tried Associated symptoms: myalgias   Associated symptoms: no abdominal pain, no fever and no sore throat   Risk factors: sick contacts     History reviewed. No pertinent past medical history. History reviewed. No pertinent past surgical history. Family History  Problem Relation Age of Onset  . Hypertension Other    History  Substance Use Topics  . Smoking status: Never Smoker   . Smokeless tobacco: Not on file  . Alcohol Use: No   OB History   Grav Para Term Preterm Abortions TAB SAB Ect Mult Living                 Review of Systems  HENT: Negative for sore throat.   Gastrointestinal: Positive for vomiting. Negative for abdominal pain.  Musculoskeletal: Positive for myalgias.  All other systems reviewed and are negative.     Allergies  Hydrocodone  Home Medications   Prior to Admission medications   Medication Sig Start Date End Date Taking? Authorizing Provider  ibuprofen (ADVIL,MOTRIN) 600 MG tablet Take 1 tablet (600 mg total) by mouth every 6 (six) hours as needed. 11/24/13   Rolan BuccoMelanie Belfi, MD  promethazine (PHENERGAN) 25 MG suppository Place 1 suppository (25 mg total) rectally every 6 (six) hours as needed for nausea. 03/17/12 03/24/12  Ward GivensIva L Knapp, MD  traMADol (ULTRAM) 50 MG tablet Take 1 tablet (50 mg total) by mouth every 6  (six) hours as needed. 11/24/13   Rolan BuccoMelanie Belfi, MD   BP 122/95  Pulse 100  Temp(Src) 98.1 F (36.7 C) (Oral)  Resp 18  Ht 5\' 2"  (1.575 m)  Wt 137 lb (62.143 kg)  BMI 25.05 kg/m2  SpO2 100%  LMP 02/17/2014 Physical Exam  Nursing note and vitals reviewed. Constitutional: She is oriented to person, place, and time. She appears well-developed and well-nourished.  HENT:  Head: Normocephalic and atraumatic.  Right Ear: External ear normal.  Left Ear: External ear normal.  Mouth/Throat: Oropharynx is clear and moist.  Eyes: EOM are normal.  Neck: Normal range of motion.  Cardiovascular: Normal rate, regular rhythm and normal heart sounds.   Pulmonary/Chest: Effort normal and breath sounds normal.  Abdominal: Soft. She exhibits no distension.  Musculoskeletal: Normal range of motion.  Neurological: She is alert and oriented to person, place, and time.  Skin: Skin is warm.  Psychiatric: She has a normal mood and affect.    ED Course  Procedures (including critical care time) Labs Review Labs Reviewed - No data to display  Imaging Review No results found.   EKG Interpretation None     Results for orders placed during the hospital encounter of 03/11/14  URINALYSIS, ROUTINE W REFLEX MICROSCOPIC      Result Value Ref Range   Color, Urine AMBER (*) YELLOW   APPearance CLEAR  CLEAR   Specific Gravity, Urine 1.023  1.005 - 1.030   pH  8.0  5.0 - 8.0   Glucose, UA NEGATIVE  NEGATIVE mg/dL   Hgb urine dipstick NEGATIVE  NEGATIVE   Bilirubin Urine NEGATIVE  NEGATIVE   Ketones, ur 15 (*) NEGATIVE mg/dL   Protein, ur 30 (*) NEGATIVE mg/dL   Urobilinogen, UA 1.0  0.0 - 1.0 mg/dL   Nitrite NEGATIVE  NEGATIVE   Leukocytes, UA TRACE (*) NEGATIVE  PREGNANCY, URINE      Result Value Ref Range   Preg Test, Ur NEGATIVE  NEGATIVE  CBC WITH DIFFERENTIAL      Result Value Ref Range   WBC 11.5 (*) 4.0 - 10.5 K/uL   RBC 4.70  3.87 - 5.11 MIL/uL   Hemoglobin 14.2  12.0 - 15.0 g/dL   HCT  16.143.0  09.636.0 - 04.546.0 %   MCV 91.5  78.0 - 100.0 fL   MCH 30.2  26.0 - 34.0 pg   MCHC 33.0  30.0 - 36.0 g/dL   RDW 40.912.7  81.111.5 - 91.415.5 %   Platelets 284  150 - 400 K/uL   Neutrophils Relative % 89 (*) 43 - 77 %   Neutro Abs 10.2 (*) 1.7 - 7.7 K/uL   Lymphocytes Relative 6 (*) 12 - 46 %   Lymphs Abs 0.7  0.7 - 4.0 K/uL   Monocytes Relative 5  3 - 12 %   Monocytes Absolute 0.6  0.1 - 1.0 K/uL   Eosinophils Relative 0  0 - 5 %   Eosinophils Absolute 0.0  0.0 - 0.7 K/uL   Basophils Relative 0  0 - 1 %   Basophils Absolute 0.0  0.0 - 0.1 K/uL  COMPREHENSIVE METABOLIC PANEL      Result Value Ref Range   Sodium 143  137 - 147 mEq/L   Potassium 3.9  3.7 - 5.3 mEq/L   Chloride 105  96 - 112 mEq/L   CO2 26  19 - 32 mEq/L   Glucose, Bld 99  70 - 99 mg/dL   BUN 9  6 - 23 mg/dL   Creatinine, Ser 7.820.70  0.50 - 1.10 mg/dL   Calcium 8.9  8.4 - 95.610.5 mg/dL   Total Protein 7.7  6.0 - 8.3 g/dL   Albumin 4.2  3.5 - 5.2 g/dL   AST 19  0 - 37 U/L   ALT 15  0 - 35 U/L   Alkaline Phosphatase 72  39 - 117 U/L   Total Bilirubin 0.3  0.3 - 1.2 mg/dL   GFR calc non Af Amer >90  >90 mL/min   GFR calc Af Amer >90  >90 mL/min  URINE MICROSCOPIC-ADD ON      Result Value Ref Range   Squamous Epithelial / LPF RARE  RARE   WBC, UA 0-2  <3 WBC/hpf   Bacteria, UA MANY (*) RARE   Urine-Other MUCOUS PRESENT     No results found.  MDM   Final diagnoses:  Vomiting  UTI (lower urinary tract infection)   Pt given Iv fluids and zofran.   Pt feels better.  Pt able to tolerate po fluids     Lonia SkinnerLeslie K Linds CrossingSofia, [place] JerseyPA-C 03/11/14 [id number]

## 2014-03-11 NOTE — Discharge Instructions (Signed)
Nausea and Vomiting °Nausea is a sick feeling that often comes before throwing up (vomiting). Vomiting is a reflex where stomach contents come out of your mouth. Vomiting can cause severe loss of body fluids (dehydration). Children and elderly adults can become dehydrated quickly, especially if they also have diarrhea. Nausea and vomiting are symptoms of a condition or disease. It is important to find the cause of your symptoms. °CAUSES  °· Direct irritation of the stomach lining. This irritation can result from increased acid production (gastroesophageal reflux disease), infection, food poisoning, taking certain medicines (such as nonsteroidal anti-inflammatory drugs), alcohol use, or tobacco use. °· Signals from the brain. These signals could be caused by a headache, heat exposure, an inner ear disturbance, increased pressure in the brain from injury, infection, a tumor, or a concussion, pain, emotional stimulus, or metabolic problems. °· An obstruction in the gastrointestinal tract (bowel obstruction). °· Illnesses such as diabetes, hepatitis, gallbladder problems, appendicitis, kidney problems, cancer, sepsis, atypical symptoms of a heart attack, or eating disorders. °· Medical treatments such as chemotherapy and radiation. °· Receiving medicine that makes you sleep (general anesthetic) during surgery. °DIAGNOSIS °Your caregiver may ask for tests to be done if the problems do not improve after a few days. Tests may also be done if symptoms are severe or if the reason for the nausea and vomiting is not clear. Tests may include: °· Urine tests. °· Blood tests. °· Stool tests. °· Cultures (to look for evidence of infection). °· X-rays or other imaging studies. °Test results can help your caregiver make decisions about treatment or the need for additional tests. °TREATMENT °You need to stay well hydrated. Drink frequently but in small amounts. You may wish to drink water, sports drinks, clear broth, or eat frozen  ice pops or gelatin dessert to help stay hydrated. When you eat, eating slowly may help prevent nausea. There are also some antinausea medicines that may help prevent nausea. °HOME CARE INSTRUCTIONS  °· Take all medicine as directed by your caregiver. °· If you do not have an appetite, do not force yourself to eat. However, you must continue to drink fluids. °· If you have an appetite, eat a normal diet unless your caregiver tells you differently. °· Eat a variety of complex carbohydrates (rice, wheat, potatoes, bread), lean meats, yogurt, fruits, and vegetables. °· Avoid high-fat foods because they are more difficult to digest. °· Drink enough water and fluids to keep your urine clear or pale yellow. °· If you are dehydrated, ask your caregiver for specific rehydration instructions. Signs of dehydration may include: °· Severe thirst. °· Dry lips and mouth. °· Dizziness. °· Dark urine. °· Decreasing urine frequency and amount. °· Confusion. °· Rapid breathing or pulse. °SEEK IMMEDIATE MEDICAL CARE IF:  °· You have blood or brown flecks (like coffee grounds) in your vomit. °· You have black or bloody stools. °· You have a severe headache or stiff neck. °· You are confused. °· You have severe abdominal pain. °· You have chest pain or trouble breathing. °· You do not urinate at least once every 8 hours. °· You develop cold or clammy skin. °· You continue to vomit for longer than 24 to 48 hours. °· You have a fever. °MAKE SURE YOU:  °· Understand these instructions. °· Will watch your condition. °· Will get help right away if you are not doing well or get worse. °Document Released: 11/10/2005 Document Revised: 02/02/2012 Document Reviewed: 04/09/2011 °ExitCare® Patient Information ©2014 [employer]. ° °Urinary Tract Infection °Urinary   tract infections (UTIs) can develop anywhere along your urinary tract. Your urinary tract is your body's drainage system for removing wastes and extra water. Your urinary tract includes  two kidneys, two ureters, a bladder, and a urethra. Your kidneys are a pair of bean-shaped organs. Each kidney is about the size of your fist. They are located below your ribs, one on each side of your spine. °CAUSES °Infections are caused by microbes, which are microscopic organisms, including fungi, viruses, and bacteria. These organisms are so small that they can only be seen through a microscope. Bacteria are the microbes that most commonly cause UTIs. °SYMPTOMS  °Symptoms of UTIs may vary by age and gender of the patient and by the location of the infection. Symptoms in young women typically include a frequent and intense urge to urinate and a painful, burning feeling in the bladder or urethra during urination. Older women and men are more likely to be tired, shaky, and weak and have muscle aches and abdominal pain. A fever may mean the infection is in your kidneys. Other symptoms of a kidney infection include pain in your back or sides below the ribs, nausea, and vomiting. °DIAGNOSIS °To diagnose a UTI, your caregiver will ask you about your symptoms. Your caregiver also will ask to provide a urine sample. The urine sample will be tested for bacteria and white blood cells. White blood cells are made by your body to help fight infection. °TREATMENT  °Typically, UTIs can be treated with medication. Because most UTIs are caused by a bacterial infection, they usually can be treated with the use of antibiotics. The choice of antibiotic and length of treatment depend on your symptoms and the type of bacteria causing your infection. °HOME CARE INSTRUCTIONS °· If you were prescribed antibiotics, take them exactly as your caregiver instructs you. Finish the medication even if you feel better after you have only taken some of the medication. °· Drink enough water and fluids to keep your urine clear or pale yellow. °· Avoid caffeine, tea, and carbonated beverages. They tend to irritate your bladder. °· Empty your bladder  often. Avoid holding urine for long periods of time. °· Empty your bladder before and after sexual intercourse. °· After a bowel movement, women should cleanse from front to back. Use each tissue only once. °SEEK MEDICAL CARE IF:  °· You have back pain. °· You develop a fever. °· Your symptoms do not begin to resolve within 3 days. °SEEK IMMEDIATE MEDICAL CARE IF:  °· You have severe back pain or lower abdominal pain. °· You develop chills. °· You have nausea or vomiting. °· You have continued burning or discomfort with urination. °MAKE SURE YOU:  °· Understand these instructions. °· Will watch your condition. °· Will get help right away if you are not doing well or get worse. °Document Released: 08/20/2005 Document Revised: 05/11/2012 Document Reviewed: 12/19/2011 °ExitCare® Patient Information ©2014 [employer]. ° °

## 2014-03-11 NOTE — ED Provider Notes (Signed)
Medical screening examination/treatment/procedure(s) were performed by non-physician practitioner and as supervising physician I was immediately available for consultation/collaboration.   EKG Interpretation None        Jodel Mayhall, MD 03/11/14 2343 

## 2014-03-11 NOTE — ED Notes (Signed)
Onset of vomiting around 0500, diarrhea. Unable to eat or drink.  C/o abdominal cramping with vomiting.

## 2014-03-28 ENCOUNTER — Encounter (HOSPITAL_BASED_OUTPATIENT_CLINIC_OR_DEPARTMENT_OTHER): Payer: Self-pay | Admitting: Emergency Medicine

## 2014-03-28 ENCOUNTER — Emergency Department (HOSPITAL_BASED_OUTPATIENT_CLINIC_OR_DEPARTMENT_OTHER)
Admission: EM | Admit: 2014-03-28 | Discharge: 2014-03-28 | Disposition: A | Discharge status: Home / Self Care | Payer: Medicaid Other | Attending: Emergency Medicine | Admitting: Emergency Medicine

## 2014-03-28 DIAGNOSIS — H53149 Visual discomfort, unspecified: Secondary | ICD-10-CM | POA: Insufficient documentation

## 2014-03-28 DIAGNOSIS — R51 Headache: Secondary | ICD-10-CM | POA: Insufficient documentation

## 2014-03-28 DIAGNOSIS — R519 Headache, unspecified: Secondary | ICD-10-CM

## 2014-03-28 DIAGNOSIS — R112 Nausea with vomiting, unspecified: Secondary | ICD-10-CM | POA: Insufficient documentation

## 2014-03-28 DIAGNOSIS — Z3202 Encounter for pregnancy test, result negative: Secondary | ICD-10-CM | POA: Insufficient documentation

## 2014-03-28 DIAGNOSIS — R5383 Other fatigue: Secondary | ICD-10-CM

## 2014-03-28 DIAGNOSIS — Z792 Long term (current) use of antibiotics: Secondary | ICD-10-CM | POA: Insufficient documentation

## 2014-03-28 DIAGNOSIS — R5381 Other malaise: Secondary | ICD-10-CM | POA: Insufficient documentation

## 2014-03-28 DIAGNOSIS — Z8679 Personal history of other diseases of the circulatory system: Secondary | ICD-10-CM | POA: Insufficient documentation

## 2014-03-28 DIAGNOSIS — N390 Urinary tract infection, site not specified: Secondary | ICD-10-CM | POA: Insufficient documentation

## 2014-03-28 HISTORY — DX: Migraine, unspecified, not intractable, without status migrainosus: G43.909

## 2014-03-28 LAB — COMPREHENSIVE METABOLIC PANEL
ALT: 10 U/L (ref 0–35)
AST: 15 U/L (ref 0–37)
Albumin: 4.4 g/dL (ref 3.5–5.2)
Alkaline Phosphatase: 58 U/L (ref 39–117)
BUN: 7 mg/dL (ref 6–23)
CO2: 25 mEq/L (ref 19–32)
Calcium: 9.9 mg/dL (ref 8.4–10.5)
Chloride: 102 mEq/L (ref 96–112)
Creatinine, Ser: 0.7 mg/dL (ref 0.50–1.10)
GFR calc non Af Amer: >90 mL/min (ref >90)
GLUCOSE: 113 mg/dL — AB (ref 70–99)
POTASSIUM: 3.6 meq/L — AB (ref 3.7–5.3)
Sodium: 141 mEq/L (ref 137–147)
TOTAL PROTEIN: 7.7 g/dL (ref 6.0–8.3)
Total Bilirubin: 0.3 mg/dL (ref 0.3–1.2)

## 2014-03-28 LAB — URINE MICROSCOPIC-ADD ON

## 2014-03-28 LAB — CBC WITH DIFFERENTIAL/PLATELET
Basophils Absolute: 0 10*3/uL (ref 0.0–0.1)
Basophils Relative: 1 % (ref 0–1)
EOS ABS: 0.1 10*3/uL (ref 0.0–0.7)
Eosinophils Relative: 2 % (ref 0–5)
HCT: 42.1 % (ref 36.0–46.0)
Hemoglobin: 14.1 g/dL (ref 12.0–15.0)
LYMPHS PCT: 30 % (ref 12–46)
Lymphs Abs: 1.5 10*3/uL (ref 0.7–4.0)
MCH: 30.5 pg (ref 26.0–34.0)
MCHC: 33.5 g/dL (ref 30.0–36.0)
MCV: 91.1 fL (ref 78.0–100.0)
Monocytes Absolute: 0.5 10*3/uL (ref 0.1–1.0)
Monocytes Relative: 10 % (ref 3–12)
NEUTROS ABS: 2.9 10*3/uL (ref 1.7–7.7)
Neutrophils Relative %: 57 % (ref 43–77)
PLATELETS: 277 10*3/uL (ref 150–400)
RBC: 4.62 MIL/uL (ref 3.87–5.11)
RDW: 12.2 % (ref 11.5–15.5)
WBC: 5.1 10*3/uL (ref 4.0–10.5)

## 2014-03-28 LAB — URINALYSIS, ROUTINE W REFLEX MICROSCOPIC
Glucose, UA: NEGATIVE mg/dL
HGB URINE DIPSTICK: NEGATIVE
Ketones, ur: 15 mg/dL — AB
Nitrite: NEGATIVE
PH: 6 (ref 5.0–8.0)
Protein, ur: NEGATIVE mg/dL
SPECIFIC GRAVITY, URINE: 1.025 (ref 1.005–1.030)
UROBILINOGEN UA: 1 mg/dL (ref 0.0–1.0)

## 2014-03-28 LAB — LIPASE, BLOOD: Lipase: 20 U/L (ref 11–59)

## 2014-03-28 LAB — PREGNANCY, URINE: Preg Test, Ur: NEGATIVE

## 2014-03-28 MED ORDER — ONDANSETRON HCL 4 MG/2ML IJ SOLN
4.0000 mg | Freq: Once | INTRAMUSCULAR | Status: AC
  Administered 2014-03-28: 4 mg via INTRAVENOUS
  Filled 2014-03-28: qty 2

## 2014-03-28 MED ORDER — METOCLOPRAMIDE HCL 5 MG/ML IJ SOLN
10.0000 mg | Freq: Once | INTRAMUSCULAR | Status: AC
  Administered 2014-03-28: 10 mg via INTRAVENOUS
  Filled 2014-03-28: qty 2

## 2014-03-28 MED ORDER — SODIUM CHLORIDE 0.9 % IV BOLUS (SEPSIS)
1000.0000 mL | Freq: Once | INTRAVENOUS | Status: AC
  Administered 2014-03-28: 1000 mL via INTRAVENOUS

## 2014-03-28 MED ORDER — DIPHENHYDRAMINE HCL 50 MG/ML IJ SOLN
25.0000 mg | Freq: Once | INTRAMUSCULAR | Status: AC
  Administered 2014-03-28: 25 mg via INTRAVENOUS
  Filled 2014-03-28: qty 1

## 2014-03-28 MED ORDER — NITROFURANTOIN MONOHYD MACRO 100 MG PO CAPS: 100.0000 mg | ORAL_CAPSULE | Freq: Two times a day (BID) | ORAL | Status: DC

## 2014-03-28 NOTE — ED Notes (Signed)
Pt c/o " migraine" x 2 weeks " on and off" with n/v

## 2014-03-28 NOTE — ED Provider Notes (Signed)
CSN: [id number]     Arrival date & time 03/28/14  1820 History  This chart was scribed for Patricia [name] Verl Kitson, MD by Bronson CurbJacqueline Melvin, ED Scribe. This patient was seen in room MH08/MH08 and the patient's care was started at 6:34 PM.    Chief Complaint  Patient presents with  . Migraine    The history is provided by the patient. No language interpreter was used.    HPI Comments: Patricia [name] is a 33 y.o. female who presents to the Emergency Department complaining of gradually worsening headaches that began two weeks ago. She reports she was seen here two weeks ago for nausea, emesis, and HA. After receiving treatment for her symptoms, she reports that the HA still persists. There is associated photophobia, fatigue, nausea, and emesis. She denies LOC, fever, chills, rash, and swelling. Patient states she has no recent history of migraines. Patient does not smoke, but does drink occasionally.  Past Medical History  Diagnosis Date  . Migraine    Past Surgical History  Procedure Laterality Date  . Tubal ligation     Family History  Problem Relation Age of Onset  . Hypertension Other    History  Substance Use Topics  . Smoking status: Never Smoker   . Smokeless tobacco: Not on file  . Alcohol Use: No   OB History   Grav Para Term Preterm Abortions TAB SAB Ect Mult Living                 Review of Systems  Constitutional:       Per HPI, otherwise negative  HENT:       Per HPI, otherwise negative  Respiratory:       Per HPI, otherwise negative  Cardiovascular:       Per HPI, otherwise negative  Gastrointestinal: Positive for vomiting.  Endocrine:       Negative aside from HPI  Genitourinary:       Neg aside from HPI   Musculoskeletal:       Per HPI, otherwise negative  Skin: Negative.   Neurological: Negative for syncope.      Allergies  Hydrocodone  Home Medications   Prior to Admission medications   Medication Sig Start Date End Date Taking? Authorizing  Provider  ibuprofen (ADVIL,MOTRIN) 600 MG tablet Take 1 tablet (600 mg total) by mouth every 6 (six) hours as needed. 11/24/13   Rolan BuccoMelanie Belfi, MD  nitrofurantoin, macrocrystal-monohydrate, (MACROBID) 100 MG capsule Take 1 capsule (100 mg total) by mouth 2 (two) times daily. 03/11/14   Elson AreasLeslie K Sofia, PA-C  ondansetron (ZOFRAN ODT) 4 MG disintegrating tablet Take 1 tablet (4 mg total) by mouth every 8 (eight) hours as needed for nausea or vomiting. 03/11/14   Elson AreasLeslie K Sofia, PA-C  promethazine (PHENERGAN) 25 MG suppository Place 1 suppository (25 mg total) rectally every 6 (six) hours as needed for nausea. 03/17/12 03/24/12  Ward GivensIva L Knapp, MD  traMADol (ULTRAM) 50 MG tablet Take 1 tablet (50 mg total) by mouth every 6 (six) hours as needed. 11/24/13   Rolan BuccoMelanie Belfi, MD   Triage Vitals: BP 129/74  Pulse 94  Temp(Src) 99 F (37.2 C)  Resp 16  Ht 5\' 2"  (1.575 m)  Wt 143 lb (64.864 kg)  BMI 26.15 kg/m2  SpO2 100%  LMP 03/20/2014  Physical Exam  Nursing note and vitals reviewed. Constitutional: She is oriented to person, place, and time. She appears well-developed and well-nourished. No distress.  HENT:  Head: Normocephalic and  atraumatic.  Eyes: Conjunctivae and EOM are normal.  Neck: Normal range of motion. Neck supple.  Cardiovascular: Normal rate, regular rhythm and normal heart sounds.   Pulmonary/Chest: Effort normal and breath sounds normal. No stridor. No respiratory distress.  Abdominal: She exhibits no distension. There is no tenderness.  Musculoskeletal: She exhibits no edema.  Neurological: She is alert and oriented to person, place, and time. She has normal strength and normal reflexes. No cranial nerve deficit or sensory deficit. Coordination and gait normal.  Skin: Skin is warm and dry.  Psychiatric: She has a normal mood and affect.    ED Course  Procedures (including critical care time)  DIAGNOSTIC STUDIES: Oxygen Saturation is 100% on room air, normal by my interpretation.     COORDINATION OF CARE: At 6:50 Discussed treatment plan with patient which includes labs. Patient agrees.   Labs Review Labs Reviewed  COMPREHENSIVE METABOLIC PANEL - Abnormal; Notable for the following:    Potassium 3.6 (*)    Glucose, Bld 113 (*)    All other components within normal limits  URINALYSIS, ROUTINE W REFLEX MICROSCOPIC - Abnormal; Notable for the following:    Color, Urine AMBER (*)    APPearance CLOUDY (*)    Bilirubin Urine SMALL (*)    Ketones, ur 15 (*)    Leukocytes, UA SMALL (*)    All other components within normal limits  URINE MICROSCOPIC-ADD ON - Abnormal; Notable for the following:    Squamous Epithelial / LPF FEW (*)    Bacteria, UA FEW (*)    All other components within normal limits  CBC WITH DIFFERENTIAL  LIPASE, BLOOD  PREGNANCY, URINE    8:24 PM On re-exam the patient is substantially better.  She has no ongoing complaints.  MDM   Patient presents with concerns of ongoing headaches, generalized discomfort.  On exam patient is awake, alert, with a reassuring neurologic exam, stable vital signs, and labs are also reassuring. Patient's headache resolved entirely and she had no ongoing complaints after treatment with fluids, medication. Patient was discharged in stable condition with antibiotics given her nausea, abnormal urinalysis.   I personally performed the services described in this documentation, which was scribed in my presence. The recorded information has been reviewed and is accurate.      Patricia [name] Sartaj Hoskin, MD 03/28/14 2027

## 2014-03-28 NOTE — Discharge Instructions (Signed)
As discussed, your evaluation today has been largely reassuring.  But, it is important that you monitor your condition carefully, and do not hesitate to return to the ED if you develop new, or concerning changes in your condition. ? ?Otherwise, please follow-up with your physician for appropriate ongoing care. ? ?

## 2015-01-11 ENCOUNTER — Emergency Department (HOSPITAL_COMMUNITY)
Admission: EM | Admit: 2015-01-11 | Discharge: 2015-01-11 | Disposition: A | Discharge status: Home / Self Care | Payer: Medicaid Other | Attending: Emergency Medicine | Admitting: Emergency Medicine

## 2015-01-11 ENCOUNTER — Encounter (HOSPITAL_COMMUNITY): Payer: Self-pay | Admitting: *Deleted

## 2015-01-11 ENCOUNTER — Emergency Department (HOSPITAL_COMMUNITY): Payer: Medicaid Other

## 2015-01-11 DIAGNOSIS — Z8679 Personal history of other diseases of the circulatory system: Secondary | ICD-10-CM | POA: Insufficient documentation

## 2015-01-11 DIAGNOSIS — Y92410 Unspecified street and highway as the place of occurrence of the external cause: Secondary | ICD-10-CM | POA: Diagnosis not present

## 2015-01-11 DIAGNOSIS — Y998 Other external cause status: Secondary | ICD-10-CM | POA: Insufficient documentation

## 2015-01-11 DIAGNOSIS — Z043 Encounter for examination and observation following other accident: Secondary | ICD-10-CM

## 2015-01-11 DIAGNOSIS — S59911A Unspecified injury of right forearm, initial encounter: Secondary | ICD-10-CM | POA: Diagnosis present

## 2015-01-11 DIAGNOSIS — Y9389 Activity, other specified: Secondary | ICD-10-CM | POA: Diagnosis not present

## 2015-01-11 DIAGNOSIS — Z79899 Other long term (current) drug therapy: Secondary | ICD-10-CM | POA: Insufficient documentation

## 2015-01-11 DIAGNOSIS — Z041 Encounter for examination and observation following transport accident: Secondary | ICD-10-CM

## 2015-01-11 DIAGNOSIS — S3992XA Unspecified injury of lower back, initial encounter: Secondary | ICD-10-CM | POA: Diagnosis not present

## 2015-01-11 DIAGNOSIS — M79631 Pain in right forearm: Secondary | ICD-10-CM

## 2015-01-11 MED ORDER — IBUPROFEN 800 MG PO TABS: 800.0000 mg | ORAL_TABLET | Freq: Three times a day (TID) | ORAL | Status: DC

## 2015-01-11 MED ORDER — METHOCARBAMOL 500 MG PO TABS: 500.0000 mg | ORAL_TABLET | Freq: Two times a day (BID) | ORAL | Status: DC

## 2015-01-11 MED ORDER — ACETAMINOPHEN 500 MG PO TABS
1000.0000 mg | ORAL_TABLET | Freq: Once | ORAL | Status: AC
  Administered 2015-01-11: 1000 mg via ORAL
  Filled 2015-01-11 (×2): qty 2

## 2015-01-11 NOTE — ED Notes (Signed)
Mom complains of back pain. Pain 4/10.
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
## 2015-01-11 NOTE — ED Notes (Signed)
Pt returned from xray
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
## 2015-01-11 NOTE — ED Notes (Signed)
Patient transported to X-ray 
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
## 2015-01-12 NOTE — ED Provider Notes (Signed)
Medical screening examination/treatment/procedure(s) were performed by non-physician practitioner and as supervising physician I was immediately available for consultation/collaboration.   EKG Interpretation None        Georgianna Band, DO 01/12/15 0028 

## 2015-10-31 ENCOUNTER — Emergency Department (HOSPITAL_COMMUNITY): Payer: Medicaid Other

## 2015-10-31 ENCOUNTER — Inpatient Hospital Stay (HOSPITAL_COMMUNITY)
Admission: EM | Admit: 2015-10-31 | Discharge: 2015-11-01 | DRG: 641 | Discharge status: Against Medical Advice | Payer: Self-pay | Attending: Internal Medicine | Admitting: Internal Medicine

## 2015-10-31 DIAGNOSIS — Z8249 Family history of ischemic heart disease and other diseases of the circulatory system: Secondary | ICD-10-CM

## 2015-10-31 DIAGNOSIS — Z885 Allergy status to narcotic agent status: Secondary | ICD-10-CM

## 2015-10-31 DIAGNOSIS — E440 Moderate protein-calorie malnutrition: Secondary | ICD-10-CM | POA: Diagnosis present

## 2015-10-31 DIAGNOSIS — R55 Syncope and collapse: Secondary | ICD-10-CM | POA: Diagnosis present

## 2015-10-31 DIAGNOSIS — R569 Unspecified convulsions: Secondary | ICD-10-CM | POA: Diagnosis present

## 2015-10-31 DIAGNOSIS — R1310 Dysphagia, unspecified: Secondary | ICD-10-CM

## 2015-10-31 DIAGNOSIS — E162 Hypoglycemia, unspecified: Principal | ICD-10-CM | POA: Diagnosis present

## 2015-10-31 DIAGNOSIS — F4323 Adjustment disorder with mixed anxiety and depressed mood: Secondary | ICD-10-CM | POA: Diagnosis present

## 2015-10-31 DIAGNOSIS — Z6824 Body mass index (BMI) 24.0-24.9, adult: Secondary | ICD-10-CM

## 2015-10-31 LAB — CBC
HEMATOCRIT: 43 % (ref 36.0–46.0)
Hemoglobin: 13.7 g/dL (ref 12.0–15.0)
MCH: 29.7 pg (ref 26.0–34.0)
MCHC: 31.9 g/dL (ref 30.0–36.0)
MCV: 93.3 fL (ref 78.0–100.0)
PLATELETS: 279 10*3/uL (ref 150–400)
RBC: 4.61 MIL/uL (ref 3.87–5.11)
RDW: 12.7 % (ref 11.5–15.5)
WBC: 5 10*3/uL (ref 4.0–10.5)

## 2015-10-31 LAB — CBG MONITORING, ED
GLUCOSE-CAPILLARY: 112 mg/dL — AB (ref 65–99)
GLUCOSE-CAPILLARY: 49 mg/dL — AB (ref 65–99)
GLUCOSE-CAPILLARY: 110 mg/dL — AB (ref 65–99)
Glucose-Capillary: 49 mg/dL — ABNORMAL LOW (ref 65–99)
Glucose-Capillary: 158 mg/dL — ABNORMAL HIGH (ref 65–99)

## 2015-10-31 LAB — BASIC METABOLIC PANEL
Anion gap: 9 (ref 5–15)
BUN: 11 mg/dL (ref 6–20)
CO2: 25 mmol/L (ref 22–32)
CREATININE: 0.81 mg/dL (ref 0.44–1.00)
Calcium: 9.1 mg/dL (ref 8.9–10.3)
Chloride: 103 mmol/L (ref 101–111)
GFR calc Af Amer: >60 mL/min (ref >60)
GLUCOSE: 92 mg/dL (ref 65–99)
Potassium: 3.6 mmol/L (ref 3.5–5.1)
SODIUM: 137 mmol/L (ref 135–145)

## 2015-10-31 LAB — I-STAT BETA HCG BLOOD, ED (MC, WL, AP ONLY): I-stat hCG, quantitative: <5 m[IU]/mL (ref <5)

## 2015-10-31 LAB — RAPID URINE DRUG SCREEN, HOSP PERFORMED
AMPHETAMINES: NOT DETECTED
Barbiturates: NOT DETECTED
Benzodiazepines: POSITIVE — AB
Cocaine: NOT DETECTED
OPIATES: NOT DETECTED
TETRAHYDROCANNABINOL: NOT DETECTED

## 2015-10-31 LAB — GLUCOSE, CAPILLARY: GLUCOSE-CAPILLARY: 87 mg/dL (ref 65–99)

## 2015-10-31 MED ORDER — PANTOPRAZOLE SODIUM 40 MG IV SOLR
40.0000 mg | INTRAVENOUS | Status: DC
  Administered 2015-10-31: 40 mg via INTRAVENOUS
  Filled 2015-10-31 (×2): qty 40

## 2015-10-31 MED ORDER — DEXTROSE 50 % IV SOLN
INTRAVENOUS | Status: AC
  Filled 2015-10-31: qty 50

## 2015-10-31 MED ORDER — DEXTROSE 10 % IV SOLN
INTRAVENOUS | Status: DC
  Administered 2015-10-31: 23:00:00 via INTRAVENOUS
  Filled 2015-10-31 (×2): qty 1000

## 2015-10-31 MED ORDER — DEXTROSE 50 % IV SOLN
1.0000 | Freq: Once | INTRAVENOUS | Status: AC
  Administered 2015-10-31: 50 mL via INTRAVENOUS

## 2015-10-31 MED ORDER — ACETAMINOPHEN 325 MG PO TABS
650.0000 mg | ORAL_TABLET | Freq: Four times a day (QID) | ORAL | Status: DC | PRN
  Administered 2015-11-01: 650 mg via ORAL
  Filled 2015-10-31: qty 2

## 2015-10-31 MED ORDER — ACETAMINOPHEN 650 MG RE SUPP: 650.0000 mg | Freq: Four times a day (QID) | RECTAL | Status: DC | PRN

## 2015-10-31 MED ORDER — DEXTROSE 50 % IV SOLN
1.0000 | Freq: Once | INTRAVENOUS | Status: AC
  Administered 2015-10-31: 50 mL via INTRAVENOUS
  Filled 2015-10-31: qty 50

## 2015-10-31 MED ORDER — DEXTROSE 10 % IV SOLN: INTRAVENOUS | Status: DC

## 2015-10-31 MED ORDER — ONDANSETRON HCL 4 MG/2ML IJ SOLN: 4.0000 mg | Freq: Four times a day (QID) | INTRAMUSCULAR | Status: DC | PRN

## 2015-10-31 MED ORDER — ENOXAPARIN SODIUM 40 MG/0.4ML ~~LOC~~ SOLN
40.0000 mg | Freq: Every day | SUBCUTANEOUS | Status: DC
  Administered 2015-10-31: 40 mg via SUBCUTANEOUS
  Filled 2015-10-31 (×2): qty 0.4

## 2015-10-31 MED ORDER — ONDANSETRON HCL 4 MG PO TABS: 4.0000 mg | ORAL_TABLET | Freq: Four times a day (QID) | ORAL | Status: DC | PRN

## 2015-10-31 MED ORDER — KETOROLAC TROMETHAMINE 15 MG/ML IJ SOLN
15.0000 mg | Freq: Once | INTRAMUSCULAR | Status: AC
  Administered 2015-10-31: 15 mg via INTRAVENOUS
  Filled 2015-10-31: qty 1

## 2015-10-31 MED ORDER — ALUM & MAG HYDROXIDE-SIMETH 200-200-20 MG/5ML PO SUSP: 30.0000 mL | Freq: Four times a day (QID) | ORAL | Status: DC | PRN

## 2015-10-31 MED ORDER — SODIUM CHLORIDE 0.9 % IV BOLUS (SEPSIS)
500.0000 mL | Freq: Once | INTRAVENOUS | Status: AC
  Administered 2015-10-31: 500 mL via INTRAVENOUS

## 2015-10-31 NOTE — ED Notes (Signed)
Attempted to call report; RN is unavailable and will call back.

## 2015-10-31 NOTE — ED Notes (Signed)
Pt to CT

## 2015-10-31 NOTE — ED Notes (Signed)
PA made aware of CBG
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
## 2015-10-31 NOTE — Progress Notes (Signed)
Jenkins MD notified that patient has arrived on unit. Patient visiting with friend in room. No complaints at this time. Droplet precautions placed as flu PCR orders are in place.
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
## 2015-10-31 NOTE — ED Notes (Signed)
PA made aware.

## 2015-10-31 NOTE — H&P (Addendum)
[facility] Admission History and Physical       Patricia [name] BZJ:[id number] DOB: Aug 08, 1981 DOA: 10/31/2015  Referring physician: EDP PCP: No PCP Per Patient  Specialists:   Chief Complaint: Seizure  HPI: Patricia [name] is a 34 y.o. female with a history of Migraine Headaches who presented to the ED after being found by her family at home with seizure activity.  EMS was called and she was found to have a glucose level in the 40's.  She was given IV Dextrose x1 and had improvement of her glucose level but in the ED her Glucose level decreased again and she had to be given more IV Dextrose.   A CT scan of the head was performed and was negative.   She was referred for admission and IV D10 was ordered.   On interview of the patient the patient reports not eating very much at times due to loss of appetite, depression, and at times difficulty swallowing.  She reports having symptoms for months.    She denies having any previous seizure history, and denies taking any diabetic medications.      Review of Systems:  Constitutional: No Weight Loss, No Weight Gain, Night Sweats, Fevers, Chills, Dizziness, Light Headedness, Fatigue, or Generalized Weakness HEENT: No Headaches, +Difficulty Swallowing,Tooth/Dental Problems,Sore Throat,  No Sneezing, Rhinitis, Ear Ache, Nasal Congestion, or Post Nasal Drip,  Cardio-vascular:  No Chest pain, Orthopnea, PND, Edema in Lower Extremities, Anasarca, Dizziness, Palpitations  Resp: No Dyspnea, No DOE, No Productive Cough, No Non-Productive Cough, No Hemoptysis, No Wheezing.    GI: No Heartburn, Indigestion, Abdominal Pain, Nausea, Vomiting, Diarrhea, Constipation, Hematemesis, Hematochezia, Melena, Change in Bowel Habits,  +Loss of Appetite  GU: No Dysuria, No Change in Color of Urine, No Urgency or Urinary Frequency, No Flank pain.  Musculoskeletal: No Joint Pain or Swelling, No Decreased Range of Motion, No Back Pain.  Neurologic: No Syncope, No  Seizures, Muscle Weakness, Paresthesia, Vision Disturbance or Loss, No Diplopia, No Vertigo, No Difficulty Walking,  Skin: No Rash or Lesions. Psych: No Change in Mood or Affect, +Depression and +Anxiety, No Memory loss, No Confusion, or Hallucinations   Past Medical History  Diagnosis Date  . Migraine      Past Surgical History  Procedure Laterality Date  . Tubal ligation        Prior to Admission medications   Not on File     Allergies  Allergen Reactions  . Hydrocodone Hives    Social History:  Married, Husband is in Prison, and has 3 Children  reports that she has never smoked. She does not have any smokeless tobacco history on file. She reports that she does not drink alcohol or use illicit drugs.    Family History  Problem Relation Age of Onset  . Hypertension Other        Physical Exam:  GEN:  Pleasant Thin  34 y.o.African American female examined and in no acute distress; cooperative with exam Filed Vitals:   10/31/15 1807 10/31/15 1820 10/31/15 1900 10/31/15 2000  BP:  121/80 122/77 96/50  Pulse: 93 101 104 103  Temp:      TempSrc:      Resp: SpO2: 100% 100% 100% 100%   Blood pressure 96/50, pulse 103, temperature 98.4 F (36.9 C), temperature source Oral, resp. rate 24, last menstrual period 10/24/2015, SpO2 100 %. PSYCH: She is alert and oriented x4; does not appear anxious does not appear depressed; affect  is normal HEENT: Normocephalic and Atraumatic, Mucous membranes pink; PERRLA; EOM intact; Fundi:  Benign;  No scleral icterus, Nares: Patent, Oropharynx: Clear, Fair Dentition,    Neck:  FROM, No Cervical Lymphadenopathy nor Thyromegaly or Carotid Bruit; No JVD; Breasts:: Not examined CHEST WALL: No tenderness CHEST: Normal respiration, clear to auscultation bilaterally HEART: Regular rate and rhythm; no murmurs rubs or gallops BACK: No kyphosis or scoliosis; No CVA tenderness ABDOMEN: Positive Bowel Sounds,  Soft Non-Tender, No  Rebound or Guarding; No Masses, No Organomegaly. Rectal Exam: Not done EXTREMITIES: No Cyanosis, Clubbing, or Edema; No Ulcerations. Genitalia: not examined PULSES: 2+ and symmetric SKIN: Normal hydration no rash or ulceration CNS:  Alert and Oriented x 4, No Focal Deficits Vascular: pulses palpable throughout    Labs on Admission:  Basic Metabolic Panel:  Recent Labs Lab 10/31/15 1600  NA 137  K 3.6  CL 103  CO2 25  GLUCOSE 92  BUN 11  CREATININE 0.81  CALCIUM 9.1   Liver Function Tests: No results for input(s): AST, ALT, ALKPHOS, BILITOT, PROT, ALBUMIN in the last 168 hours. No results for input(s): LIPASE, AMYLASE in the last 168 hours. No results for input(s): AMMONIA in the last 168 hours. CBC:  Recent Labs Lab 10/31/15 1600  WBC 5.0  HGB 13.7  HCT 43.0  MCV 93.3  PLT 279   Cardiac Enzymes: No results for input(s): CKTOTAL, CKMB, CKMBINDEX, TROPONINI in the last 168 hours.  BNP (last 3 results) No results for input(s): BNP in the last 8760 hours.  ProBNP (last 3 results) No results for input(s): PROBNP in the last 8760 hours.  CBG:  Recent Labs Lab 10/31/15 1556 10/31/15 1627 10/31/15 1735 10/31/15 1820 10/31/15 2016  GLUCAP 49* 112* 49* 158* 110*    Radiological Exams on Admission: Ct Head Wo Contrast  10/31/2015  CLINICAL DATA:  Seizure. EXAM: CT HEAD WITHOUT CONTRAST TECHNIQUE: Contiguous axial images were obtained from the base of the skull through the vertex without intravenous contrast. COMPARISON:  None. FINDINGS: No acute cortical infarct, hemorrhage, or mass lesion ispresent. Ventricles are of normal size. No significant extra-axial fluid collection is present. The paranasal sinuses andmastoid air cells are clear. The osseous skull is intact. IMPRESSION: Normal brain. Electronically Signed   By: Signa Kell M.D.   On: 10/31/2015 17:33          Assessment/Plan:      34 y.o. female with  Principal Problem:   1.    Hypoglycemia- due  to probable depletion of Glycogen stores over time   IV D10  For Fluids   Monitor Glucose every hour x 12 hours and Follow Hypoglycemic Protocol   Encouraged to eat regularly       Active Problems:   2.    Seizure (HCC)- due to #1   monitor       3.    Dysphagia- due to Structural Defect or Anxiety   Esophagram in AM to evaluate for structural defect     4.    Adjustment disorder with mixed anxiety and depressed mood- refer to Social history above   Refer as outpatient for Stress management and Rx for Depression     5.    DVT Prophylaxis   Lovenox    Code Status:     FULL CODE    Family Communication:   Family at Bedside     Disposition Plan:    Inpatient Status        Time spent:  70 Minutes      Ron ParkerJENKINS,Fynn Vanblarcom C [facility] Pager [phone]   If 7AM -7PM Please Contact the Day Rounding Team MD for [facility]  If 7PM-7AM, Please Contact Night-Floor Coverage  [email or web address] Password [email or web address] 10/31/2015, 9:37 PM     ADDENDUM:   Patient was seen and examined on 10/31/2015

## 2015-10-31 NOTE — ED Notes (Addendum)
pts family went and got food. Pt is eating a cheese burger,drinking orange juice and soda

## 2015-10-31 NOTE — ED Notes (Addendum)
Per ems pt is from home, 36 year old son "witnessed pt having a seizure for 10 minutes". Upon ems arrival pt acting tired, lethargic but answering questions appropriately. Reports she had a seizure 2 years ago, but is not on any seizure medications. Pt takes no medications. PERRLA.   Per friend who did not witness activity. Pt was walking, started feeling dizzy and then had syncopal episode, and was shaking.   Upon rn assessment. Pt alert and oriented x4. Will not keep eyes open. Denies pain. Reports she was nauseas, but denies at present.

## 2015-10-31 NOTE — ED Notes (Signed)
Pt reports increased headache and worries that her blood sugar may have dropped again.  CBG recheck 110. No acute distress.

## 2015-11-01 ENCOUNTER — Encounter (HOSPITAL_COMMUNITY): Payer: Self-pay | Admitting: *Deleted

## 2015-11-01 DIAGNOSIS — E440 Moderate protein-calorie malnutrition: Secondary | ICD-10-CM

## 2015-11-01 DIAGNOSIS — F4323 Adjustment disorder with mixed anxiety and depressed mood: Secondary | ICD-10-CM

## 2015-11-01 DIAGNOSIS — E162 Hypoglycemia, unspecified: Principal | ICD-10-CM

## 2015-11-01 DIAGNOSIS — R569 Unspecified convulsions: Secondary | ICD-10-CM

## 2015-11-01 LAB — BASIC METABOLIC PANEL
ANION GAP: 3 — AB (ref 5–15)
BUN: 11 mg/dL (ref 6–20)
CHLORIDE: 109 mmol/L (ref 101–111)
CO2: 25 mmol/L (ref 22–32)
Calcium: 8.3 mg/dL — ABNORMAL LOW (ref 8.9–10.3)
Creatinine, Ser: 0.72 mg/dL (ref 0.44–1.00)
GFR calc Af Amer: >60 mL/min (ref >60)
GFR calc non Af Amer: >60 mL/min (ref >60)
Glucose, Bld: 106 mg/dL — ABNORMAL HIGH (ref 65–99)
POTASSIUM: 3.7 mmol/L (ref 3.5–5.1)
SODIUM: 137 mmol/L (ref 135–145)

## 2015-11-01 LAB — CBC
HEMATOCRIT: 37.7 % (ref 36.0–46.0)
HEMOGLOBIN: 12 g/dL (ref 12.0–15.0)
MCH: 29.5 pg (ref 26.0–34.0)
MCHC: 31.8 g/dL (ref 30.0–36.0)
MCV: 92.6 fL (ref 78.0–100.0)
PLATELETS: 244 10*3/uL (ref 150–400)
RBC: 4.07 MIL/uL (ref 3.87–5.11)
RDW: 12.7 % (ref 11.5–15.5)
WBC: 6.3 10*3/uL (ref 4.0–10.5)

## 2015-11-01 LAB — GLUCOSE, CAPILLARY
GLUCOSE-CAPILLARY: 72 mg/dL (ref 65–99)
GLUCOSE-CAPILLARY: 90 mg/dL (ref 65–99)
GLUCOSE-CAPILLARY: 83 mg/dL (ref 65–99)
GLUCOSE-CAPILLARY: 62 mg/dL — AB (ref 65–99)
GLUCOSE-CAPILLARY: 83 mg/dL (ref 65–99)
GLUCOSE-CAPILLARY: 102 mg/dL — AB (ref 65–99)
GLUCOSE-CAPILLARY: 95 mg/dL (ref 65–99)
Glucose-Capillary: 105 mg/dL — ABNORMAL HIGH (ref 65–99)
Glucose-Capillary: 79 mg/dL (ref 65–99)
Glucose-Capillary: 88 mg/dL (ref 65–99)
Glucose-Capillary: 97 mg/dL (ref 65–99)
Glucose-Capillary: 99 mg/dL (ref 65–99)

## 2015-11-01 MED ORDER — INFLUENZA VAC SPLIT QUAD 0.5 ML IM SUSY: 0.5000 mL | PREFILLED_SYRINGE | INTRAMUSCULAR | Status: DC

## 2015-11-01 MED ORDER — IBUPROFEN 200 MG PO TABS: 400.0000 mg | ORAL_TABLET | Freq: Two times a day (BID) | ORAL | Status: DC | PRN

## 2015-11-01 MED ORDER — CARISOPRODOL 350 MG PO TABS
350.0000 mg | ORAL_TABLET | Freq: Every day | ORAL | Status: DC
  Administered 2015-11-01: 350 mg via ORAL
  Filled 2015-11-01: qty 1

## 2015-11-01 NOTE — Progress Notes (Signed)
[facility] Progress Note    Patient: Patricia [name]    ZOX:[id number]  DOB: 12/14/1980     DOA: 10/31/2015 Date of Service: the patient was seen and examined on 11/01/2015 Day 1 of admission.  Subjective: Patient denies having any acute complaint. Denies any dizziness or lightheadedness or confusion. She denies remembering the event that brought to the hospital yesterday. She denies having any prior episodes of shaking or passing out. She denies any prior episodes of low blood sugars. She does not appear off any history of diabetes and that she does not use insulin or any herbal supplements that can affect her sugars.  Nutrition: No nausea or vomiting and tolerating oral diet Activity: Ambulating in the room Last BM: Prior to arrival  Assessment and Plan: 1. Hypoglycemia Patient presented with an episode of hypoglycemia followed by shaking. Patient was found on the ground having shaking all over. On arrival to ER she was alert and oriented. Workup including CT of the head was unremarkable. Patient was found CBG of 40 and due to no improvement with single dose of IV dextrose she was started on dextrose infusion and was admitted to hospital. Overnight her sugars improved significantly and that the patient at present is at her baseline. We will discontinue IV fluids and monitor CBG every 2 hours. Recommend oral intake. Most likely due to poor oral intake since the patient has lost 32 pounds over last 6 months.  2. Moderate malnutrition. Patient has lost 32 pounds over last 6 months. Would continue close monitor. Recommend oral diet regular.  3 adjustment disorder with anxiety and depressed mood. Currently continuing her home medications Soma.  4. Seizure-like activity. Patient was reportedly having shaking activity at the present time of my evaluation she does not have any neurological deficit. CT of the head is unremarkable. Possibility of hypoglycemic seizure cannot be ruled  out although most likely this is the tremors and syncope rather than an actual seizure. We will monitor with seizure prophylaxis.  DVT Prophylaxis: subcutaneous Heparin Nutrition: Regular diet Advance goals of care discussion: Full code  Brief Summary of Hospitalization:  HPI: As per the H&P dictated on 10/31/2015 and the patient was brought in because of an episode of unresponsiveness followed by shaking all over her body without any loss of control of bowel or bladder. She was alert awake and oriented in ER on arrival. Daily update: 10/31/2015 patient was found hypoglycemic, 11/01/2015 dextrose infusion was stopped Consultants: None Procedures: None Antibiotics:  None Family Communication: family was present at bedside, opportunity was given to ask question and all questions were answered satisfactorily at the time of interview.  Disposition:  Expected discharge date: 11/02/2015 Barriers to safe discharge: hypoglycemia  No intake or output data in the 24 hours ending 11/01/15 1323 There were no vitals filed for this visit.  Objective: Physical Exam: Filed Vitals:   10/31/15 1900 10/31/15 2000 10/31/15 2055 11/01/15 0021  BP: 122/77 96/50 111/59 105/82  Pulse: 104 103 100 86  Temp:   98.7 F (37.1 C) 98.4 F (36.9 C)  TempSrc:   Oral Oral  Resp: 26 24 18 18   SpO2: 100% 100% 100% 100%     General: Appear in mild distress, no Rash; Oral Mucosa moist. Cardiovascular: S1 and S2 Present, no Murmur, no JVD Respiratory: Bilateral Air entry present and Clear to Auscultation, no Crackles, no wheezes Abdomen: Bowel Sound present, Soft and no tenderness Extremities: no Pedal edema, no calf tenderness Neurology: Grossly no focal neuro  deficit.  Data Reviewed: CBC:  Recent Labs Lab 10/31/15 1600 11/01/15 0535  WBC 5.0 6.3  HGB 13.7 12.0  HCT 43.0 37.7  MCV 93.3 92.6  PLT 279 244   Basic Metabolic Panel:  Recent Labs Lab 10/31/15 1600 11/01/15 0535  NA 137 137  K  3.6 3.7  CL 103 109  CO2 25 25  GLUCOSE 92 106*  BUN 11 11  CREATININE 0.81 0.72  CALCIUM 9.1 8.3*   Liver Function Tests: No results for input(s): AST, ALT, ALKPHOS, BILITOT, PROT, ALBUMIN in the last 168 hours. No results for input(s): LIPASE, AMYLASE in the last 168 hours. No results for input(s): AMMONIA in the last 168 hours.  Cardiac Enzymes: No results for input(s): CKTOTAL, CKMB, CKMBINDEX, TROPONINI in the last 168 hours. BNP (last 3 results) No results for input(s): BNP in the last 8760 hours.  ProBNP (last 3 results) No results for input(s): PROBNP in the last 8760 hours.   CBG:  Recent Labs Lab 11/01/15 0557 11/01/15 0824 11/01/15 1009 11/01/15 1122 11/01/15 1216  GLUCAP 83 79 62* 83 97    No results found for this or any previous visit (from the past 240 hour(s)).   Studies: Ct Head Wo Contrast  10/31/2015  CLINICAL DATA:  Seizure. EXAM: CT HEAD WITHOUT CONTRAST TECHNIQUE: Contiguous axial images were obtained from the base of the skull through the vertex without intravenous contrast. COMPARISON:  None. FINDINGS: No acute cortical infarct, hemorrhage, or mass lesion ispresent. Ventricles are of normal size. No significant extra-axial fluid collection is present. The paranasal sinuses andmastoid air cells are clear. The osseous skull is intact. IMPRESSION: Normal brain. Electronically Signed   By: Signa Kell M.D.   On: 10/31/2015 17:33     Scheduled Meds: . carisoprodol  350 mg Oral Daily  . enoxaparin (LOVENOX) injection  40 mg Subcutaneous QHS  . [START ON 11/02/2015] Influenza vac split quadrivalent PF  0.5 mL Intramuscular Tomorrow-1000   Continuous Infusions:   Time spent: 30 minutes  Author: Lynden Oxford, MD [facility] Pager: [phone] 11/01/2015 1:23 PM  If 7PM-7AM, please contact night-coverage at [email or web address], password [facility]

## 2015-11-01 NOTE — Progress Notes (Addendum)
Pt stating she needs to leave because there is not anyone to take care of her 3 children. Paged MD. MD coming to speak with the patient.  After MD spoke to patient encouraging to stay overnight, patient still stated that she needed to leave to care for her children. She stated that she would check her blood sugar frequently and verbalized understanding of what to do if she experienced another low blood sugar.   Patient subsequently signed AMA papers and left the hospital with children and cousin.

## 2015-11-01 NOTE — Progress Notes (Signed)
Initial Nutrition Assessment  DOCUMENTATION CODES:   Non-severe (moderate) malnutrition in context of chronic illness  INTERVENTION:  - RD will continue to monitor for needs  NUTRITION DIAGNOSIS:   Inadequate oral intake related to chronic illness as evidenced by per patient/family report.  GOAL:   Patient will meet greater than or equal to 90% of their needs  MONITOR:   PO intake, Weight trends, Labs, I & O's  REASON FOR ASSESSMENT:   Malnutrition Screening Tool  ASSESSMENT:   34 y.o. female with a history of Migraine Headaches who presented to the ED after being found by her family at home with seizure activity. EMS was called and she was found to have a glucose level in the 40's. She was given IV Dextrose x1 and had improvement of her glucose level but in the ED her Glucose level decreased again and she had to be given more IV Dextrose. A CT scan of the head was performed and was negative. She was referred for admission and IV D10 was ordered. On interview of the patient the patient reports not eating very much at times due to loss of appetite, depression, and at times difficulty swallowing. She reports having symptoms for months. She denies having any previous seizure history, and denies taking any diabetic medications.  Pt seen for MST. BMI indicates normal weight/borderline over weight. BMI and estimated nutrition needs based on self-reported weight of 133 lbs (60 kg) as no new weight in chart since 03/28/14. No intakes documented but pt reports she was able to eat breakfast this AM and recently had 4 ounces of orange juice due to hypoglycemic event. Lunch tray arrived shortly after RD left the room. Pt denies abdominal pain or nausea with breakfast and states that she ate well and felt hungry prior to eating this meal.  Pt anxious during discussion and makes minimal eye contact and answers are short in nature; she mainly does not look up from her cell phone. States "I  just want to go home." Asked her about appetite PTA and she states that she does not feel it is a lack of appetite, but, rather, being stressed and feeling that she is overwhelmed with the amount of things she feels she needs to do. Due to this, she often does not make time to eat and when she has time to eat she feels too tired to do so.   Pt states that 6 months ago she weighed 165 lbs and that she currently weighs 133 lbs. This indicates 32 lb weight loss (19% body weight) in this time frame which is significant. Pt prefers physical assessment not be done at this time.    Not meeting needs PTA. Medications reviewed. Labs reviewed; CBGs: 49-158 mg/dL, Ca: 8.3 mg/dL.   Diet Order:  Diet regular Room service appropriate?: Yes; Fluid consistency:: Thin  Skin:  Reviewed, no issues  Last BM:  PTA  Height:   Ht Readings from Last 1 Encounters:  03/28/14 5\' 2"  (1.575 m)    Weight:   Wt Readings from Last 1 Encounters:  03/28/14 143 lb (64.864 kg)    Ideal Body Weight:  50 kg (kg)  BMI:  24.67 kg/m2  Estimated Nutritional Needs:   Kcal:  1510-1710  Protein:  55-65 grams  Fluid:  2 L/day  EDUCATION NEEDS:   No education needs identified at this time     Trenton GammonJessica Draydon Clairmont, RD, LDN Inpatient Clinical Dietitian Pager # [phone] After hours/weekend pager # [phone]

## 2015-11-02 NOTE — Discharge Summary (Signed)
[facility] Discharge Summary   Patient: Patricia [name]    ZOX:[id number] PCP: Pcp Not In System    DOB: 1981-03-25 Date of admission: 10/31/2015  Date of discharge: 11/02/2015   Discharge Diagnoses:  Principal Problem:   Hypoglycemia Active Problems:   Dysphagia   Adjustment disorder with mixed anxiety and depressed mood   Malnutrition of moderate degree   Seizure-like activity (HCC)  History of present illness: As per the H and P dictated on admission, "Patricia [name] is a 34 y.o. female with a history of Migraine Headaches who presented to the ED after being found by her family at home with seizure activity. EMS was called and she was found to have a glucose level in the 40's. She was given IV Dextrose x1 and had improvement of her glucose level but in the ED her Glucose level decreased again and she had to be given more IV Dextrose. A CT scan of the head was performed and was negative. She was referred for admission and IV D10 was ordered. On interview of the patient the patient reports not eating very much at times due to loss of appetite, depression, and at times difficulty swallowing. She reports having symptoms for months. She denies having any previous seizure history, and denies taking any diabetic medications."  Hospital Course:  The pt presented with episode of loss of consciousness with possible seizure like event. She was hypoglycemic at the time of event and with treatment with IV dextrose her mentation was better and she did not have further episode in the hospital. Thus she is meeting [facility] and will need further work up regarding her episode. While the pt was reccommended to remain in the hospital for further work up which will be ideally done as fasting, pt decided to leave against medical advice. Pt was informed that she should not be driving until seen by her PCP and not involved in activities which require significant focus or operate heavy  machinery. Patient verbalized understanding, signed AMA form and left hospital.  Procedures and Results:  none   Consultations:  none  Allergies  Allergen Reactions  . Hydrocodone Hives    The results of significant diagnostics from this hospitalization (including imaging, microbiology, ancillary and laboratory) are listed below for reference.    Significant Diagnostic Studies: Ct Head Wo Contrast  10/31/2015  CLINICAL DATA:  Seizure. EXAM: CT HEAD WITHOUT CONTRAST TECHNIQUE: Contiguous axial images were obtained from the base of the skull through the vertex without intravenous contrast. COMPARISON:  None. FINDINGS: No acute cortical infarct, hemorrhage, or mass lesion ispresent. Ventricles are of normal size. No significant extra-axial fluid collection is present. The paranasal sinuses andmastoid air cells are clear. The osseous skull is intact. IMPRESSION: Normal brain. Electronically Signed   By: Signa Kell M.D.   On: 10/31/2015 17:33    Microbiology: No results found for this or any previous visit (from the past 240 hour(s)).   Labs: CBC:  Recent Labs Lab 10/31/15 1600 11/01/15 0535  WBC 5.0 6.3  HGB 13.7 12.0  HCT 43.0 37.7  MCV 93.3 92.6  PLT 279 244   Basic Metabolic Panel:  Recent Labs Lab 10/31/15 1600 11/01/15 0535  NA 137 137  K 3.6 3.7  CL 103 109  CO2 25 25  GLUCOSE 92 106*  BUN 11 11  CREATININE 0.81 0.72  CALCIUM 9.1 8.3*   CBG:  Recent Labs Lab 11/01/15 1009 11/01/15 1122 11/01/15 1216 11/01/15 1530 11/01/15 1812  GLUCAP 62* 83 97 102* 95    Time spent: 20 minutes  Signed:  Liz Pinho  [facility] 11/02/2015, 7:21 AM

## 2017-10-04 ENCOUNTER — Encounter (HOSPITAL_COMMUNITY): Payer: Self-pay | Admitting: *Deleted

## 2017-10-04 ENCOUNTER — Emergency Department (HOSPITAL_COMMUNITY)
Admission: EM | Admit: 2017-10-04 | Discharge: 2017-10-05 | Disposition: A | Discharge status: Home / Self Care | Payer: Medicaid Other | Attending: Emergency Medicine | Admitting: Emergency Medicine

## 2017-10-04 ENCOUNTER — Other Ambulatory Visit: Payer: Self-pay

## 2017-10-04 ENCOUNTER — Emergency Department (HOSPITAL_COMMUNITY): Payer: Medicaid Other

## 2017-10-04 DIAGNOSIS — Z79899 Other long term (current) drug therapy: Secondary | ICD-10-CM | POA: Insufficient documentation

## 2017-10-04 DIAGNOSIS — R1033 Periumbilical pain: Secondary | ICD-10-CM

## 2017-10-04 DIAGNOSIS — R6883 Chills (without fever): Secondary | ICD-10-CM | POA: Diagnosis not present

## 2017-10-04 DIAGNOSIS — N390 Urinary tract infection, site not specified: Secondary | ICD-10-CM

## 2017-10-04 DIAGNOSIS — R112 Nausea with vomiting, unspecified: Secondary | ICD-10-CM

## 2017-10-04 LAB — CBC
HCT: 43 % (ref 36.0–46.0)
Hemoglobin: 14.2 g/dL (ref 12.0–15.0)
MCH: 30.2 pg (ref 26.0–34.0)
MCHC: 33 g/dL (ref 30.0–36.0)
MCV: 91.5 fL (ref 78.0–100.0)
PLATELETS: 318 10*3/uL (ref 150–400)
RBC: 4.7 MIL/uL (ref 3.87–5.11)
RDW: 12.3 % (ref 11.5–15.5)
WBC: 7.3 10*3/uL (ref 4.0–10.5)

## 2017-10-04 LAB — COMPREHENSIVE METABOLIC PANEL
ALK PHOS: 53 U/L (ref 38–126)
ALT: 12 U/L — AB (ref 14–54)
AST: 19 U/L (ref 15–41)
Albumin: 4 g/dL (ref 3.5–5.0)
Anion gap: 11 (ref 5–15)
BUN: 6 mg/dL (ref 6–20)
CALCIUM: 9.1 mg/dL (ref 8.9–10.3)
CO2: 25 mmol/L (ref 22–32)
CREATININE: 0.76 mg/dL (ref 0.44–1.00)
Chloride: 101 mmol/L (ref 101–111)
Glucose, Bld: 85 mg/dL (ref 65–99)
Potassium: 3.8 mmol/L (ref 3.5–5.1)
Sodium: 137 mmol/L (ref 135–145)
Total Bilirubin: 0.7 mg/dL (ref 0.3–1.2)
Total Protein: 7.7 g/dL (ref 6.5–8.1)

## 2017-10-04 LAB — URINALYSIS, ROUTINE W REFLEX MICROSCOPIC
Bilirubin Urine: NEGATIVE
Glucose, UA: NEGATIVE mg/dL
KETONES UR: 80 mg/dL — AB
Nitrite: NEGATIVE
Protein, ur: NEGATIVE mg/dL
Specific Gravity, Urine: 1.039 — ABNORMAL HIGH (ref 1.005–1.030)
pH: 6 (ref 5.0–8.0)

## 2017-10-04 LAB — I-STAT BETA HCG BLOOD, ED (MC, WL, AP ONLY): I-stat hCG, quantitative: <5 m[IU]/mL (ref <5)

## 2017-10-04 LAB — LIPASE, BLOOD: Lipase: 20 U/L (ref 11–51)

## 2017-10-04 MED ORDER — IOPAMIDOL (ISOVUE-300) INJECTION 61%
INTRAVENOUS | Status: AC
  Administered 2017-10-04: 100 mL via INTRAVENOUS
  Filled 2017-10-04: qty 100

## 2017-10-04 MED ORDER — SODIUM CHLORIDE 0.9 % IV BOLUS (SEPSIS)
1000.0000 mL | Freq: Once | INTRAVENOUS | Status: AC
  Administered 2017-10-05: 1000 mL via INTRAVENOUS

## 2017-10-04 MED ORDER — DEXTROSE 5 % IV SOLN
1.0000 g | Freq: Once | INTRAVENOUS | Status: AC
  Administered 2017-10-05: 1 g via INTRAVENOUS
  Filled 2017-10-04: qty 10

## 2017-10-04 MED ORDER — SODIUM CHLORIDE 0.9 % IV BOLUS (SEPSIS)
1000.0000 mL | Freq: Once | INTRAVENOUS | Status: AC
  Administered 2017-10-04: 1000 mL via INTRAVENOUS

## 2017-10-04 MED ORDER — MORPHINE SULFATE (PF) 4 MG/ML IV SOLN
4.0000 mg | Freq: Once | INTRAVENOUS | Status: AC
  Administered 2017-10-04: 4 mg via INTRAVENOUS
  Filled 2017-10-04: qty 1

## 2017-10-04 MED ORDER — ONDANSETRON HCL 4 MG/2ML IJ SOLN
4.0000 mg | Freq: Once | INTRAMUSCULAR | Status: AC
  Administered 2017-10-04: 4 mg via INTRAVENOUS
  Filled 2017-10-04: qty 2

## 2017-10-04 NOTE — ED Provider Notes (Signed)
[facility]-EMERGENCY DEPT Provider Note   CSN: [id number] Arrival date & time: 10/04/17  2010     History   Chief Complaint Chief Complaint  Patient presents with  . Emesis  . Nausea    HPI Patricia [name] is a 36 y.o. female with a PMHx of migraines and chronic back pain, and PSHx of tubal ligation, who presents to the ED with complaints of sudden onset periumbilical abdominal pain, nausea, vomiting, and chills that began at 5 AM.  Patient describes her pain as initially 10/10 but currently 5/10 intermittent crampy nonradiating periumbilical pain, worse with movement, improved with rest, and unrelieved with Aleve.  She reports that she has had approximately 20 episodes of nonbloody nonbilious emesis since onset.  She received zofran en route which helped her nausea. She denies any coffee-ground emesis.  She endorses frequent NSAID use.  LMP 09/04/17.  [facility]  She denies fevers, URI symptoms, cough, CP, SOB, diarrhea/constipation, obstipation, melena, hematochezia, hematemesis, hematuria, dysuria, vaginal bleeding/discharge, myalgias, arthralgias, numbness, tingling, focal weakness, or any other complaints at this time. Denies recent travel, sick contacts, suspicious food intake, or EtOH use.    The history is provided by the patient and medical records. No language interpreter was used.  Emesis   Associated symptoms include abdominal pain and chills. Pertinent negatives include no arthralgias, no cough, no diarrhea, no fever and no myalgias.  Abdominal Cramping  This is a new problem. The current episode started 12 to 24 hours ago. Episode frequency: intermittently. The problem has not changed since onset.Associated symptoms include abdominal pain. Pertinent negatives include no chest pain and no shortness of breath. Exacerbated by: movement. The symptoms are relieved by rest. Treatments tried: aleve. The treatment provided no relief.     Past Medical History:  Diagnosis Date  . Migraine     Patient Active Problem List   Diagnosis Date Noted  . Malnutrition of moderate degree 11/01/2015  . Seizure-like activity (HCC) 11/01/2015  . Hypoglycemia 10/31/2015  . Dysphagia 10/31/2015  . Adjustment disorder with mixed anxiety and depressed mood 10/31/2015    Past Surgical History:  Procedure Laterality Date  . TUBAL LIGATION      OB History    No data available       Home Medications    Prior to Admission medications   Medication Sig Start Date End Date Taking? Authorizing Provider  ALPRAZolam Prudy Feeler(XANAX) 0.5 MG tablet Take 0.5 mg by mouth at bedtime as needed for sleep.    [provider]  carisoprodol (SOMA) 350 MG tablet Take 350 mg by mouth daily.    [provider]  diphenhydrAMINE (BENADRYL) 25 MG tablet Take 25 mg by mouth every 8 (eight) hours as needed.    [provider]  ibuprofen (ADVIL,MOTRIN) 200 MG tablet Take 400 mg by mouth 2 (two) times daily as needed for headache.    [provider]  predniSONE (DELTASONE) 10 MG tablet Take 10 mg by mouth daily as needed (back pain (herniated disc)).    [provider]    Family History Family History  Problem Relation Age of Onset  . Hypertension Other     Social History Social History   Tobacco Use  . Smoking status: Never Smoker  Substance Use Topics  . Alcohol use: No  . Drug use: No     Allergies   Hydrocodone   Review of Systems Review of Systems  Constitutional: Positive for chills. Negative  for fever.  HENT: Negative for rhinorrhea and sore throat.   Respiratory: Negative for cough and shortness of breath.   Cardiovascular: Negative for chest pain.  Gastrointestinal: Positive for abdominal pain, nausea and vomiting. Negative for blood in stool, constipation and diarrhea.  Genitourinary: Negative for dysuria, hematuria, vaginal bleeding and vaginal discharge.  Musculoskeletal: Negative  for arthralgias and myalgias.  Skin: Negative for color change.  Allergic/Immunologic: Negative for immunocompromised state.  Neurological: Negative for weakness and numbness.  Psychiatric/Behavioral: Negative for confusion.   All other systems reviewed and are negative for acute change except as noted in the HPI.    Physical Exam Updated Vital Signs BP 108/73 (BP Location: Right Arm)   Pulse 89   Temp 99.4 F (37.4 C) (Oral)   Resp 18   Ht 5\' 1"  (1.549 m)   Wt 70.3 kg (155 lb)   LMP 09/04/2017 (Approximate)   SpO2 99%   BMI 29.29 kg/m   Physical Exam  Constitutional: She is oriented to person, place, and time. Vital signs are normal. She appears well-developed and well-nourished.  Non-toxic appearance. No distress.  Low grade temp 99.4, nontoxic, NAD  HENT:  Head: Normocephalic and atraumatic.  Mouth/Throat: Oropharynx is clear and moist and mucous membranes are normal.  Eyes: Conjunctivae and EOM are normal. Right eye exhibits no discharge. Left eye exhibits no discharge.  Neck: Normal range of motion. Neck supple.  Cardiovascular: Normal rate, regular rhythm, normal heart sounds and intact distal pulses. Exam reveals no gallop and no friction rub.  No murmur heard. Pulmonary/Chest: Effort normal and breath sounds normal. No respiratory distress. She has no decreased breath sounds. She has no wheezes. She has no rhonchi. She has no rales.  Abdominal: Soft. Normal appearance and bowel sounds are normal. She exhibits no distension. There is tenderness in the right lower quadrant and periumbilical area. There is guarding and tenderness at McBurney's point. There is no rigidity, no rebound, no CVA tenderness and negative Murphy's sign.  Soft, nondistended, +BS throughout, with mild periumbilical TTP and moderate RLQ TTP at mcburney's point, with mild voluntary guarding, no rigidity/rebound, neg murphy's, no CVA TTP, neg foot tap test, neg psoas sign  Musculoskeletal: Normal range of  motion.  Neurological: She is alert and oriented to person, place, and time. She has normal strength. No sensory deficit.  Skin: Skin is warm, dry and intact. No rash noted.  Psychiatric: She has a normal mood and affect.  Nursing note and vitals reviewed.    ED Treatments / Results  Labs (all labs ordered are listed, but only abnormal results are displayed) Labs Reviewed  COMPREHENSIVE METABOLIC PANEL - Abnormal; Notable for the following components:      Result Value   ALT 12 (*)    All other components within normal limits  LIPASE, BLOOD  CBC  URINALYSIS, ROUTINE W REFLEX MICROSCOPIC  I-STAT BETA HCG BLOOD, ED ([facility], [facility], [facility] ONLY)    EKG  EKG Interpretation None       Radiology No results found.  Procedures Procedures (including critical care time)  Medications Ordered in ED Medications  ondansetron (ZOFRAN) injection 4 mg (not administered)  morphine 4 MG/ML injection 4 mg (not administered)  sodium chloride 0.9 % bolus 1,000 mL (1,000 mLs Intravenous New Bag/Given 10/04/17 2144)  iopamidol (ISOVUE-300) 61 % injection (100 mLs Intravenous Contrast Given 10/04/17 2217)     Initial Impression / Assessment and Plan / ED Course  I have reviewed the triage vital signs and  the nursing notes.  Pertinent labs & imaging results that were available during my care of the patient were reviewed by me and considered in my medical decision making (see chart for details).     36 y.o. female here with sudden onset periumbilical pain, n/v, and chills at 5am. On exam, mild periumbilical tenderness and moderate RLQ TTP with voluntary guarding but no rebound or rigidity, low-grade temp 99.4, but otherwise VSS. Awaiting labs, will order CT abd/pelv to r/o appendicitis vs other etiology. Pt declined wanting anything for pain, and was given zofran earlier which helped her nausea; will give fluids then reassess shortly.   10:51 PM CBC WNL. CMP WNL. Lipase WNL. BetaHCG neg. U/A and CT  pending. Pt states nausea coming back, pain slightly worse; will repeat zofran and give morphine now that she's accepting of it. Remainder of work up still pending. Patient care to be resumed by Ivar Drape, PA-C at shift change sign-out. Patient history has been discussed with midlevel resuming care. Please see their notes for further documentation of pending results and dispo/care. Pt stable at sign-out and updated on transfer of care.    Final Clinical Impressions(s) / ED Diagnoses   Final diagnoses:  Acute periumbilical pain  Nausea and vomiting in adult patient  Chills    ED Discharge Orders    [address] [place], [place] 10/04/17 2251    Little, Ambrose Finland, MD 10/04/17 [phone]

## 2017-10-04 NOTE — ED Notes (Signed)
Bed: [facility]PT1 Expected date:  Expected time:  Means of arrival:  Comments: 

## 2017-10-04 NOTE — ED Triage Notes (Signed)
Pt brought in by [facility] d/t n/v/fever that started this morning.  Pt received Zofran 4 mg enroute.

## 2017-10-04 NOTE — ED Triage Notes (Signed)
Pt stated "the nausea & vomiting started yesterday."

## 2017-10-04 NOTE — ED Provider Notes (Signed)
11:54 PM Patient signed out to me at shift change.  RLQ pain, nv, CT and UA pending.  UA concerning for UTI.  CT show normal appendix.  Radiology recommends follow-up with OBGYN for pap smear.  I discussed this with the patient, she understand that the cervix did not appear perfectly normal, and will follow-up accordingly.    Will give additional liter of fluid, fluid challenge, and give IV rocephin.  Tolerating POs.  Feels improved.  DC to home.   Roxy HorsemanBrowning, Jacinda Kanady, PA-C 10/05/17 0129    Little, Ambrose Finlandachel Morgan, MD 10/07/17 [phone]

## 2017-10-04 NOTE — ED Notes (Signed)
Bed: [facility]3 Expected date:  Expected time:  Means of arrival:  Comments: 

## 2017-10-05 MED ORDER — CEPHALEXIN 500 MG PO CAPS: 500.0000 mg | ORAL_CAPSULE | Freq: Two times a day (BID) | ORAL | 0 refills | Status: DC

## 2017-10-05 MED ORDER — ONDANSETRON HCL 4 MG PO TABS: 4.0000 mg | ORAL_TABLET | Freq: Four times a day (QID) | ORAL | 0 refills | Status: DC

## 2017-10-05 NOTE — ED Notes (Signed)
Pt was given ginger ale for PO/fluid challenge---- tolerated well; pt denies nausea at this time.

## 2017-12-28 ENCOUNTER — Ambulatory Visit (HOSPITAL_COMMUNITY)
Admission: EM | Admit: 2017-12-28 | Discharge: 2017-12-28 | Disposition: A | Discharge status: Home / Self Care | Payer: Medicaid Other | Attending: Family Medicine | Admitting: Family Medicine

## 2017-12-28 ENCOUNTER — Encounter (HOSPITAL_COMMUNITY): Payer: Self-pay | Admitting: Emergency Medicine

## 2017-12-28 ENCOUNTER — Other Ambulatory Visit: Payer: Self-pay

## 2017-12-28 DIAGNOSIS — R300 Dysuria: Secondary | ICD-10-CM

## 2017-12-28 DIAGNOSIS — N390 Urinary tract infection, site not specified: Secondary | ICD-10-CM | POA: Diagnosis not present

## 2017-12-28 DIAGNOSIS — R109 Unspecified abdominal pain: Secondary | ICD-10-CM

## 2017-12-28 LAB — POCT URINALYSIS DIP (DEVICE)
Bilirubin Urine: NEGATIVE
Glucose, UA: NEGATIVE mg/dL
Ketones, ur: NEGATIVE mg/dL
NITRITE: NEGATIVE
PH: 5.5 (ref 5.0–8.0)
PROTEIN: NEGATIVE mg/dL
Specific Gravity, Urine: 1.01 (ref 1.005–1.030)
UROBILINOGEN UA: 0.2 mg/dL (ref 0.0–1.0)

## 2017-12-28 MED ORDER — SULFAMETHOXAZOLE-TRIMETHOPRIM 800-160 MG PO TABS: 1.0000 | ORAL_TABLET | Freq: Two times a day (BID) | ORAL | 0 refills | Status: AC

## 2017-12-28 NOTE — ED Provider Notes (Signed)
[facility]   [id number] 12/28/17 Arrival Time: 1015   SUBJECTIVE:  Patricia [name] is a 37 y.o. female who presents to the urgent care with complaint of left flank pain and dysuria that started yesterday. The patient reported a recent hx of UTI treated at [facility] with Keflex one month ago.    Pain is more noticeable with cough or sneeze or leaning to left.  No fever or vomiting.  No back pain.  No blood in urine  Patient is a hair stylist  Past Medical History:  Diagnosis Date  . Migraine    Family History  Problem Relation Age of Onset  . Hypertension Other    Social History   Socioeconomic History  . Marital status: Single    Spouse name: Not on file  . Number of children: Not on file  . Years of education: Not on file  . Highest education level: Not on file  Social Needs  . Financial resource strain: Not on file  . Food insecurity - worry: Not on file  . Food insecurity - inability: Not on file  . Transportation needs - medical: Not on file  . Transportation needs - non-medical: Not on file  Occupational History  . Not on file  Tobacco Use  . Smoking status: Never Smoker  Substance and Sexual Activity  . Alcohol use: No  . Drug use: No  . Sexual activity: No    Birth control/protection: Surgical  Other Topics Concern  . Not on file  Social History Narrative  . Not on file   No outpatient medications have been marked as taking for the 12/28/17 encounter [facility](Hospital Encounter).   Allergies  Allergen Reactions  . Hydrocodone Hives      ROS: As per HPI, remainder of ROS negative.   OBJECTIVE:   Vitals:   12/28/17 1107  BP: 128/78  Pulse: 87  Resp: 18  Temp: 98.7 F (37.1 C)  TempSrc: Oral  SpO2: 100%     General appearance: alert; no distress Eyes: PERRL; EOMI; conjunctiva normal HENT: normocephalic; atraumatic;; oral mucosa normal Neck: supple Abdomen: soft, left abd is tender with deep palpation; bowel  sounds normal; no masses or organomegaly; no guarding or rebound tenderness Back: no CVA tenderness Extremities: no cyanosis or edema; symmetrical with no gross deformities Skin: warm and dry Neurologic: normal gait; grossly normal Psychological: alert and cooperative; normal mood and affect   Labs:  Results for orders placed or performed during the hospital encounter of 12/28/17  POCT urinalysis dip (device)  Result Value Ref Range   Glucose, UA NEGATIVE NEGATIVE mg/dL   Bilirubin Urine NEGATIVE NEGATIVE   Ketones, ur NEGATIVE NEGATIVE mg/dL   Specific Gravity, Urine 1.010 1.005 - 1.030   Hgb urine dipstick TRACE (A) NEGATIVE   pH 5.5 5.0 - 8.0   Protein, ur NEGATIVE NEGATIVE mg/dL   Urobilinogen, UA 0.2 0.0 - 1.0 mg/dL   Nitrite NEGATIVE NEGATIVE   Leukocytes, UA LARGE (A) NEGATIVE    Labs Reviewed  POCT URINALYSIS DIP (DEVICE) - Abnormal; Notable for the following components:      Result Value   Hgb urine dipstick TRACE (*)    Leukocytes, UA LARGE (*)    All other components within normal limits  URINE CULTURE    No results found.     ASSESSMENT & PLAN:  No diagnosis found.  No orders of the defined types were placed in this encounter.   Reviewed expectations re: course  of current medical issues. Questions answered. Outlined signs and symptoms indicating need for more acute intervention. Patient verbalized understanding. After Visit Summary given.    Procedures:      Elvina Sidle, MD 12/28/17 1126

## 2017-12-28 NOTE — Discharge Instructions (Signed)
Drink plenty of fluids to flush the infection out of your system.  Cranberry juice does not really make a difference.  Return if symptoms are worsening.

## 2017-12-28 NOTE — ED Triage Notes (Signed)
[facility] with a complaint of left flank pain and dysuria that started yesterday. The patient reported a recent hx of UTI.

## 2017-12-29 LAB — URINE CULTURE: Culture: 40000 — AB

## 2018-01-14 ENCOUNTER — Other Ambulatory Visit: Payer: Self-pay

## 2018-01-14 ENCOUNTER — Encounter (HOSPITAL_BASED_OUTPATIENT_CLINIC_OR_DEPARTMENT_OTHER): Payer: Self-pay | Admitting: Emergency Medicine

## 2018-01-14 ENCOUNTER — Emergency Department (HOSPITAL_BASED_OUTPATIENT_CLINIC_OR_DEPARTMENT_OTHER)
Admission: EM | Admit: 2018-01-14 | Discharge: 2018-01-14 | Disposition: A | Discharge status: Home / Self Care | Payer: Medicaid Other | Attending: Emergency Medicine | Admitting: Emergency Medicine

## 2018-01-14 DIAGNOSIS — M545 Low back pain, unspecified: Secondary | ICD-10-CM

## 2018-01-14 MED ORDER — METHOCARBAMOL 500 MG PO TABS: 500.0000 mg | ORAL_TABLET | Freq: Two times a day (BID) | ORAL | 0 refills | Status: DC

## 2018-01-14 NOTE — ED Triage Notes (Signed)
Patient reports fall 9 days ago.  Reports that she now has mid back pain intermittently.

## 2018-01-14 NOTE — ED Provider Notes (Signed)
[facility] EMERGENCY DEPARTMENT Provider Note   CSN: [id number] Arrival date & time: 01/14/18  7829     History   Chief Complaint Chief Complaint  Patient presents with  . Back Pain    HPI Patricia [name] is a 37 y.o. female.  HPI   Ms. Patricia [name] is a 37 year old female with a history of migraines and adjustment disorder who presents to the emergency department for evaluation of lower back pain.  Patient states that she slipped in the shower 9 days ago and fell backwards onto her left side.  She reports that she hit her head during the fall, denies loss of consciousness. Had a sore headache initially, but that improved and she denies headache now. Reports that over the past two days she has developed intermittent lower back pain. Pain is located in the left lower back. Pain is present with certain movements, like walking and twisting in certain directions. She has no pain while sitting still.  Pain is 5/10 in severity at its worst and described as "sore" in nature.  She denies fevers, chills, night sweats, weight loss, numbness, weakness, saddle anesthesia, loss of bowel or bladder control, abdominal pain, dysuria, urinary frequency.  She is able to ambulate independently despite pain.  Denies history of cancer or IV drug use.  Past Medical History:  Diagnosis Date  . Migraine     Patient Active Problem List   Diagnosis Date Noted  . Malnutrition of moderate degree 11/01/2015  . Seizure-like activity (HCC) 11/01/2015  . Hypoglycemia 10/31/2015  . Dysphagia 10/31/2015  . Adjustment disorder with mixed anxiety and depressed mood 10/31/2015    Past Surgical History:  Procedure Laterality Date  . TUBAL LIGATION      OB History    No data available       Home Medications    Prior to Admission medications   Not on File    Family History Family History  Problem Relation Age of Onset  . Hypertension Other     Social History Social History   Tobacco Use    . Smoking status: Never Smoker  Substance Use Topics  . Alcohol use: No  . Drug use: No     Allergies   Hydrocodone   Review of Systems Review of Systems  Constitutional: Negative for chills, diaphoresis, fatigue, fever and unexpected weight change.  Gastrointestinal: Negative for abdominal pain, diarrhea, nausea and vomiting.  Genitourinary: Negative for difficulty urinating, dysuria, flank pain and frequency.  Musculoskeletal: Positive for back pain. Negative for arthralgias, gait problem and neck pain.  Skin: Negative for rash and wound.  Neurological: Negative for weakness, numbness and headaches.     Physical Exam Updated Vital Signs BP (!) 125/101   Pulse 83   Temp 98.1 F (36.7 C) (Oral)   Resp 16   Ht 5\' 1"  (1.549 m)   Wt 68.9 kg (152 lb)   LMP 01/08/2018 (Approximate)   BMI 28.72 kg/m   Physical Exam  Constitutional: She appears well-developed and well-nourished. No distress.  HENT:  Head: Normocephalic and atraumatic.  Eyes: Right eye exhibits no discharge. Left eye exhibits no discharge.  Pulmonary/Chest: Effort normal. No respiratory distress.  Abdominal: Soft. Bowel sounds are normal. There is no tenderness. There is no guarding.  Musculoskeletal:  No midline T-spine or L-spine tenderness.  Mild tenderness to palpation over the left paraspinal muscles of the lumbar spine.  Strength 5/5 in bilateral knee flexion/extension and ankle dorsiflexion/plantarflexion.  DP pulses 2+ bilaterally.  Neurological: She is alert. Coordination normal.  Patellar reflex 2+ bilaterally.  Distal sensation to light touch intact in bilateral lower extremities.  Gait normal and coordination and balance.  Skin: Skin is warm and dry. Capillary refill takes less than 2 seconds. She is not diaphoretic.  Psychiatric: She has a normal mood and affect. Her behavior is normal.  Nursing note and vitals reviewed.    ED Treatments / Results  Labs (all labs ordered are listed, but  only abnormal results are displayed) Labs Reviewed - No data to display  EKG  EKG Interpretation None       Radiology No results found.  Procedures Procedures (including critical care time)  Medications Ordered in ED Medications - No data to display   Initial Impression / Assessment and Plan / ED Course  I have reviewed the triage vital signs and the nursing notes.  Pertinent labs & imaging results that were available during my care of the patient were reviewed by me and considered in my medical decision making (see chart for details).     Patient with back pain following a mechanical fall 9d ago. Suspect lumbar strain given pain located in the left paraspinal muscles. No midline pain over spinous processes of the lumbar spine. No neurological deficits and normal neuro exam.  Patient can walk but states is painful. No loss of bowel or bladder control. No concern for cauda equina.  No fever, night sweats, weight loss, h/o cancer, IVDU. RICE protocol and pain medicine indicated and discussed with patient. Follow up with PCP in a week if symptoms are not improving. Discussed return precautions and patient agrees and voices understanding to the above plan.   Final Clinical Impressions(s) / ED Diagnoses   Final diagnoses:  Acute left-sided low back pain without sciatica    ED Discharge Orders        Ordered    methocarbamol (ROBAXIN) 500 MG tablet  2 times daily     01/14/18 [id number]       Kellie ShropshireShrosbree, Yulianna Folse J, PA-C 01/14/18 1731    Alvira MondaySchlossman, Erin, MD 01/14/18 2325

## 2018-01-14 NOTE — Discharge Instructions (Signed)
Take ibuprofen 800 mg every 6 hours as needed for pain.  You can also apply heat to the lower back.  I have written you a prescription for a muscle relaxer medicine called Robaxin.  This medicine can make you drowsy so please do not drive or work while taking it.  Follow up with your regular doctor in a week if your symptoms are not improving.  Return to the ER if you have loss of sensation in your feet or legs, lose bowel or bladder control or have any new or worsening symptoms.

## 2018-01-15 ENCOUNTER — Encounter (HOSPITAL_COMMUNITY): Payer: Self-pay | Admitting: Family Medicine

## 2018-01-15 ENCOUNTER — Ambulatory Visit (HOSPITAL_COMMUNITY)
Admission: EM | Admit: 2018-01-15 | Discharge: 2018-01-15 | Disposition: A | Discharge status: Home / Self Care | Payer: Medicaid Other | Attending: Family Medicine | Admitting: Family Medicine

## 2018-01-15 DIAGNOSIS — Z041 Encounter for examination and observation following transport accident: Secondary | ICD-10-CM | POA: Diagnosis not present

## 2018-01-15 MED ORDER — NAPROXEN 500 MG PO TABS: 500.0000 mg | ORAL_TABLET | Freq: Two times a day (BID) | ORAL | 0 refills | Status: DC
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
## 2018-12-21 ENCOUNTER — Ambulatory Visit: Payer: BLUE CROSS/BLUE SHIELD | Admitting: Psychiatry

## 2018-12-30 ENCOUNTER — Ambulatory Visit: Payer: BLUE CROSS/BLUE SHIELD | Admitting: Psychiatry

## 2019-01-18 ENCOUNTER — Encounter: Payer: Self-pay | Admitting: Psychiatry

## 2019-01-18 ENCOUNTER — Ambulatory Visit: Payer: BLUE CROSS/BLUE SHIELD | Admitting: Psychiatry

## 2019-01-18 ENCOUNTER — Ambulatory Visit (INDEPENDENT_AMBULATORY_CARE_PROVIDER_SITE_OTHER): Payer: BLUE CROSS/BLUE SHIELD | Admitting: Psychiatry

## 2019-01-18 VITALS — BP 139/88 | HR 84 | Ht 60.0 in | Wt 145.0 lb

## 2019-01-18 DIAGNOSIS — F411 Generalized anxiety disorder: Secondary | ICD-10-CM

## 2019-01-18 DIAGNOSIS — F332 Major depressive disorder, recurrent severe without psychotic features: Secondary | ICD-10-CM

## 2019-01-18 MED ORDER — ESCITALOPRAM OXALATE 10 MG PO TABS: ORAL_TABLET | ORAL | 1 refills | Status: DC

## 2019-01-18 MED ORDER — PROPRANOLOL HCL 10 MG PO TABS: ORAL_TABLET | ORAL | 0 refills | Status: DC
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
## 2019-11-30 ENCOUNTER — Other Ambulatory Visit: Payer: Self-pay

## 2019-11-30 ENCOUNTER — Emergency Department (HOSPITAL_BASED_OUTPATIENT_CLINIC_OR_DEPARTMENT_OTHER)
Admission: EM | Admit: 2019-11-30 | Discharge: 2019-12-01 | Disposition: A | Discharge status: Home / Self Care | Payer: BLUE CROSS/BLUE SHIELD | Attending: Emergency Medicine | Admitting: Emergency Medicine

## 2019-11-30 ENCOUNTER — Encounter (HOSPITAL_BASED_OUTPATIENT_CLINIC_OR_DEPARTMENT_OTHER): Payer: Self-pay

## 2019-11-30 DIAGNOSIS — A5903 Trichomonal cystitis and urethritis: Secondary | ICD-10-CM | POA: Diagnosis not present

## 2019-11-30 DIAGNOSIS — M25551 Pain in right hip: Secondary | ICD-10-CM | POA: Diagnosis present

## 2019-11-30 DIAGNOSIS — M5431 Sciatica, right side: Secondary | ICD-10-CM | POA: Insufficient documentation

## 2019-11-30 DIAGNOSIS — Z79899 Other long term (current) drug therapy: Secondary | ICD-10-CM | POA: Insufficient documentation

## 2019-11-30 LAB — CBC
HCT: 46.2 % — ABNORMAL HIGH (ref 36.0–46.0)
Hemoglobin: 14.7 g/dL (ref 12.0–15.0)
MCH: 29.5 pg (ref 26.0–34.0)
MCHC: 31.8 g/dL (ref 30.0–36.0)
MCV: 92.6 fL (ref 80.0–100.0)
Platelets: 274 10*3/uL (ref 150–400)
RBC: 4.99 MIL/uL (ref 3.87–5.11)
RDW: 11.9 % (ref 11.5–15.5)
WBC: 5.3 10*3/uL (ref 4.0–10.5)
nRBC: 0 % (ref 0.0–0.2)

## 2019-11-30 LAB — COMPREHENSIVE METABOLIC PANEL
ALT: 19 U/L (ref 0–44)
AST: 21 U/L (ref 15–41)
Albumin: 4 g/dL (ref 3.5–5.0)
Alkaline Phosphatase: 57 U/L (ref 38–126)
Anion gap: 9 (ref 5–15)
BUN: 6 mg/dL (ref 6–20)
CO2: 25 mmol/L (ref 22–32)
Calcium: 8.7 mg/dL — ABNORMAL LOW (ref 8.9–10.3)
Chloride: 101 mmol/L (ref 98–111)
Creatinine, Ser: 0.8 mg/dL (ref 0.44–1.00)
GFR calc Af Amer: >60 mL/min (ref >60)
GFR calc non Af Amer: >60 mL/min (ref >60)
Glucose, Bld: 102 mg/dL — ABNORMAL HIGH (ref 70–99)
Potassium: 3.5 mmol/L (ref 3.5–5.1)
Sodium: 135 mmol/L (ref 135–145)
Total Bilirubin: 0.5 mg/dL (ref 0.3–1.2)
Total Protein: 7.3 g/dL (ref 6.5–8.1)

## 2019-11-30 LAB — PREGNANCY, URINE: Preg Test, Ur: NEGATIVE

## 2019-11-30 LAB — URINALYSIS, ROUTINE W REFLEX MICROSCOPIC
Bilirubin Urine: NEGATIVE
Glucose, UA: NEGATIVE mg/dL
Hgb urine dipstick: NEGATIVE
Ketones, ur: NEGATIVE mg/dL
Nitrite: NEGATIVE
Protein, ur: NEGATIVE mg/dL
Specific Gravity, Urine: 1.02 (ref 1.005–1.030)
pH: 5.5 (ref 5.0–8.0)

## 2019-11-30 LAB — LIPASE, BLOOD: Lipase: 23 U/L (ref 11–51)

## 2019-11-30 LAB — URINALYSIS, MICROSCOPIC (REFLEX): RBC / HPF: NONE SEEN RBC/hpf (ref 0–5)

## 2019-11-30 MED ORDER — SODIUM CHLORIDE 0.9% FLUSH
3.0000 mL | Freq: Once | INTRAVENOUS | Status: DC
  Filled 2019-11-30: qty 3

## 2019-11-30 NOTE — ED Triage Notes (Signed)
Pt c/o right side abd pain x 2 days-numbness/tingling to right LE x 5 hours-NAD-steady gait

## 2019-12-01 MED ORDER — METRONIDAZOLE 500 MG PO TABS
500.0000 mg | ORAL_TABLET | Freq: Once | ORAL | Status: AC
  Administered 2019-12-01: 01:00:00 500 mg via ORAL
  Filled 2019-12-01: qty 1

## 2019-12-01 MED ORDER — METRONIDAZOLE 500 MG PO TABS: 500.0000 mg | ORAL_TABLET | Freq: Two times a day (BID) | ORAL | 0 refills | Status: AC

## 2019-12-01 NOTE — ED Provider Notes (Signed)
[facility] Provider Note  CSN: [id number] Arrival date & time: 11/30/19 2129  Chief Complaint(s) Leg Pain  HPI Patricia [name] is a 39 y.o. female   The history is provided by the patient.  Leg Pain Location:  Leg and hip Injury: no   Hip location:  R hip Leg location:  L upper leg Pain details:    Quality:  Cramping   Severity:  Moderate   Timing:  Intermittent   Progression:  Waxing and waning Chronicity:  Chronic Relieved by:  Movement Associated symptoms: no back pain, no fever, no itching, no numbness and no swelling    She reports feeling intermittent numbness with the pain the concerned her.  Currently asymptomatic.   Past Medical History Past Medical History:  Diagnosis Date  . Migraine    Patient Active Problem List   Diagnosis Date Noted  . Malnutrition of moderate degree 11/01/2015  . Seizure-like activity ([place]) 11/01/2015  . Hypoglycemia 10/31/2015  . Dysphagia 10/31/2015  . Adjustment disorder with mixed anxiety and depressed mood 10/31/2015   Home Medication(s) Prior to Admission medications   Medication Sig Start Date End Date Taking? Authorizing Provider  escitalopram (LEXAPRO) 10 MG tablet Take 1/2 tab po qd x 4-5 days, then increase to 1 tab po qd 01/18/19   Thayer Headings, PMHNP  methocarbamol (ROBAXIN) 500 MG tablet Take 1 tablet (500 mg total) by mouth 2 (two) times daily. Patient not taking: Reported on 01/18/2019 01/14/18   Glyn Ade, PA-C  metroNIDAZOLE (FLAGYL) 500 MG tablet Take 1 tablet (500 mg total) by mouth 2 (two) times daily for 7 days. 12/01/19 12/08/19  Fatima Blank, MD  naproxen (NAPROSYN) 500 MG tablet Take 1 tablet (500 mg total) by mouth 2 (two) times daily. Patient not taking: Reported on 01/18/2019 01/15/18   Shelda Pal, DO  propranolol (INDERAL) 10 MG tablet Take 1-2 tabs po BID prn anxiety 01/18/19   Thayer Headings, PMHNP                                                                                         Past Surgical History Past Surgical History:  Procedure Laterality Date  . TUBAL LIGATION     Family History Family History  Problem Relation Age of Onset  . Hypertension Other   . Anxiety disorder Mother   . Bipolar disorder Mother   . Anxiety disorder Maternal Grandmother   . Depression Maternal Grandmother   . Autism Son   . Seizures Son   . Diabetes Maternal Great-grandmother     Social History Social History   Tobacco Use  . Smoking status: Never Smoker  . Smokeless tobacco: Never Used  Substance Use Topics  . Alcohol use: Yes    Comment: occ  . Drug use: No   Allergies Hydrocodone  Review of Systems Review of Systems  Constitutional: Negative for fever.  Musculoskeletal: Negative for back pain.  Skin: Negative for itching.   All other systems are reviewed and are negative for acute change except as noted in the HPI  Physical Exam Vital Signs  I have reviewed the triage  vital signs BP 126/88 (BP Location: Right Arm)   Pulse 88   Temp 98.6 F (37 C) (Oral)   Resp 18   Ht 5\' 2"  (1.575 m)   Wt 70.3 kg   LMP 11/24/2019   SpO2 98%   BMI 28.35 kg/m   Physical Exam Vitals reviewed.  Constitutional:      General: She is not in acute distress.    Appearance: She is well-developed. She is not diaphoretic.  HENT:     Head: Normocephalic and atraumatic.     Right Ear: External ear normal.     Left Ear: External ear normal.     Nose: Nose normal.  Eyes:     General: No scleral icterus.    Conjunctiva/sclera: Conjunctivae normal.  Neck:     Trachea: Phonation normal.  Cardiovascular:     Rate and Rhythm: Normal rate and regular rhythm.  Pulmonary:     Effort: Pulmonary effort is normal. No respiratory distress.     Breath sounds: No stridor.  Abdominal:     General: There is no distension.     Tenderness: There is no abdominal tenderness.  Musculoskeletal:         General: Normal range of motion.     Cervical back: Normal range of motion.     Right hip: No tenderness or bony tenderness. Normal range of motion. Normal strength.     Right upper leg: No swelling, deformity or tenderness.  Neurological:     Mental Status: She is alert and oriented to person, place, and time.     Sensory: Sensation is intact.     Gait: Gait is intact.  Psychiatric:        Behavior: Behavior normal.     ED Results and Treatments Labs (all labs ordered are listed, but only abnormal results are displayed) Labs Reviewed  COMPREHENSIVE METABOLIC PANEL - Abnormal; Notable for the following components:      Result Value   Glucose, Bld 102 (*)    Calcium 8.7 (*)    All other components within normal limits  CBC - Abnormal; Notable for the following components:   HCT 46.2 (*)    All other components within normal limits  URINALYSIS, ROUTINE W REFLEX MICROSCOPIC - Abnormal; Notable for the following components:   Leukocytes,Ua TRACE (*)    All other components within normal limits  URINALYSIS, MICROSCOPIC (REFLEX) - Abnormal; Notable for the following components:   Bacteria, UA RARE (*)    Trichomonas, UA PRESENT (*)    All other components within normal limits  LIPASE, BLOOD  PREGNANCY, URINE  GC/CHLAMYDIA PROBE AMP ([place]) NOT AT [facility]                                                                                                                         EKG  EKG Interpretation  Date/Time:    Ventricular Rate:    PR Interval:    QRS Duration:   QT  Interval:    QTC Calculation:   R Axis:     Text Interpretation:        Radiology No results found.  Pertinent labs & imaging results that were available during my care of the patient were reviewed by me and considered in my medical decision making (see chart for details).  Medications Ordered in ED Medications  sodium chloride flush (NS) 0.9 % injection 3 mL ( Intravenous Canceled Entry 11/30/19 2342)    metroNIDAZOLE (FLAGYL) tablet 500 mg (500 mg Oral Given 12/01/19 0125)                                                                                                                                    Procedures Procedures  (including critical care time)  Medical Decision Making / ED Course I have reviewed the nursing notes for this encounter and the patient's prior records (if available in EHR or on provided paperwork).   Patricia [name] was evaluated in Emergency Department on 12/01/2019 for the symptoms described in the history of present illness. She was evaluated in the context of the global COVID-19 pandemic, which necessitated consideration that the patient might be at risk for infection with the SARS-CoV-2 virus that causes COVID-19. Institutional protocols and algorithms that pertain to the evaluation of patients at risk for COVID-19 are in a state of rapid change based on information released by regulatory bodies including the CDC and federal and state organizations. These policies and algorithms were followed during the patient's care in the ED.  Patient here with symptoms consistent with sciatica. Likely piriformis syndrome.  In triage they believe patient was having right lower quadrant abdominal pain and work-up was initiated.  Labs are reassuring without leukocytosis, significant electrolyte derangements or renal sufficiency.  UA was positive for trichomonas urethritis.  Patient denied any vaginal discharge, pelvic pain or bleeding.  No dysuria or frequency.  No urgency.  UPT negative.  Recommended supportive management for the leg pain.  We will treat with Flagyl for trichomonas.  The patient appears reasonably screened and/or stabilized for discharge and I doubt any other medical condition or other [facility] requiring further screening, evaluation, or treatment in the ED at this time prior to discharge.  The patient is safe for discharge with strict return precautions.        Final Clinical Impression(s) / ED Diagnoses Final diagnoses:  Sciatica of right side  Trichomonal urethritis     The patient appears reasonably screened and/or stabilized for discharge and I doubt any other medical condition or other [facility] requiring further screening, evaluation, or treatment in the ED at this time prior to discharge.  Disposition: Discharge  Condition: Good  I have discussed the results, Dx and Tx plan with the patient who expressed understanding and agree(s) with the plan. Discharge instructions discussed at great length. The patient was given strict return precautions who verbalized understanding of the instructions. No  further questions at time of discharge.    ED Discharge Orders         Ordered    metroNIDAZOLE (FLAGYL) 500 MG tablet  2 times daily     12/01/19 0114            Follow Up: Primary care provider  Schedule an appointment as soon as possible for a visit  As needed     This chart was dictated using voice recognition software.  Despite best efforts to proofread,  errors can occur which can change the documentation meaning.   Nira Conn, MD 12/01/19 [phone]

## 2019-12-02 LAB — GC/CHLAMYDIA PROBE AMP (~~LOC~~) NOT AT ARMC
Chlamydia: NEGATIVE
Neisseria Gonorrhea: NEGATIVE

## 2019-12-29 ENCOUNTER — Other Ambulatory Visit: Payer: Self-pay

## 2019-12-29 ENCOUNTER — Emergency Department (HOSPITAL_BASED_OUTPATIENT_CLINIC_OR_DEPARTMENT_OTHER)
Admission: EM | Admit: 2019-12-29 | Discharge: 2019-12-29 | Disposition: A | Discharge status: Home / Self Care | Payer: BLUE CROSS/BLUE SHIELD | Attending: Emergency Medicine | Admitting: Emergency Medicine

## 2019-12-29 ENCOUNTER — Encounter (HOSPITAL_BASED_OUTPATIENT_CLINIC_OR_DEPARTMENT_OTHER): Payer: Self-pay

## 2019-12-29 ENCOUNTER — Emergency Department (HOSPITAL_BASED_OUTPATIENT_CLINIC_OR_DEPARTMENT_OTHER): Payer: BLUE CROSS/BLUE SHIELD

## 2019-12-29 DIAGNOSIS — R1031 Right lower quadrant pain: Secondary | ICD-10-CM | POA: Diagnosis present

## 2019-12-29 DIAGNOSIS — R1084 Generalized abdominal pain: Secondary | ICD-10-CM | POA: Diagnosis not present

## 2019-12-29 DIAGNOSIS — R112 Nausea with vomiting, unspecified: Secondary | ICD-10-CM | POA: Insufficient documentation

## 2019-12-29 DIAGNOSIS — K5900 Constipation, unspecified: Secondary | ICD-10-CM | POA: Insufficient documentation

## 2019-12-29 DIAGNOSIS — Z79899 Other long term (current) drug therapy: Secondary | ICD-10-CM | POA: Insufficient documentation

## 2019-12-29 DIAGNOSIS — Z885 Allergy status to narcotic agent status: Secondary | ICD-10-CM | POA: Insufficient documentation

## 2019-12-29 LAB — URINALYSIS, MICROSCOPIC (REFLEX)

## 2019-12-29 LAB — LIPASE, BLOOD: Lipase: 23 U/L (ref 11–51)

## 2019-12-29 LAB — COMPREHENSIVE METABOLIC PANEL
ALT: 12 U/L (ref 0–44)
AST: 18 U/L (ref 15–41)
Albumin: 3.7 g/dL (ref 3.5–5.0)
Alkaline Phosphatase: 44 U/L (ref 38–126)
Anion gap: 5 (ref 5–15)
BUN: 8 mg/dL (ref 6–20)
CO2: 23 mmol/L (ref 22–32)
Calcium: 8.5 mg/dL — ABNORMAL LOW (ref 8.9–10.3)
Chloride: 107 mmol/L (ref 98–111)
Creatinine, Ser: 0.82 mg/dL (ref 0.44–1.00)
GFR calc Af Amer: >60 mL/min (ref >60)
GFR calc non Af Amer: >60 mL/min (ref >60)
Glucose, Bld: 88 mg/dL (ref 70–99)
Potassium: 4.1 mmol/L (ref 3.5–5.1)
Sodium: 135 mmol/L (ref 135–145)
Total Bilirubin: 0.3 mg/dL (ref 0.3–1.2)
Total Protein: 6.7 g/dL (ref 6.5–8.1)

## 2019-12-29 LAB — CBC WITH DIFFERENTIAL/PLATELET
Abs Immature Granulocytes: 0.01 10*3/uL (ref 0.00–0.07)
Basophils Absolute: 0.1 10*3/uL (ref 0.0–0.1)
Basophils Relative: 1 %
Eosinophils Absolute: 0.1 10*3/uL (ref 0.0–0.5)
Eosinophils Relative: 3 %
HCT: 39.5 % (ref 36.0–46.0)
Hemoglobin: 12.7 g/dL (ref 12.0–15.0)
Immature Granulocytes: 0 %
Lymphocytes Relative: 38 %
Lymphs Abs: 1.5 10*3/uL (ref 0.7–4.0)
MCH: 30 pg (ref 26.0–34.0)
MCHC: 32.2 g/dL (ref 30.0–36.0)
MCV: 93.4 fL (ref 80.0–100.0)
Monocytes Absolute: 0.4 10*3/uL (ref 0.1–1.0)
Monocytes Relative: 11 %
Neutro Abs: 1.9 10*3/uL (ref 1.7–7.7)
Neutrophils Relative %: 47 %
Platelets: 289 10*3/uL (ref 150–400)
RBC: 4.23 MIL/uL (ref 3.87–5.11)
RDW: 12.5 % (ref 11.5–15.5)
WBC: 4 10*3/uL (ref 4.0–10.5)
nRBC: 0 % (ref 0.0–0.2)

## 2019-12-29 LAB — URINALYSIS, ROUTINE W REFLEX MICROSCOPIC
Bilirubin Urine: NEGATIVE
Glucose, UA: NEGATIVE mg/dL
Ketones, ur: NEGATIVE mg/dL
Leukocytes,Ua: NEGATIVE
Nitrite: NEGATIVE
Protein, ur: NEGATIVE mg/dL
Specific Gravity, Urine: 1.01 (ref 1.005–1.030)
pH: 6 (ref 5.0–8.0)

## 2019-12-29 LAB — PREGNANCY, URINE: Preg Test, Ur: NEGATIVE

## 2019-12-29 MED ORDER — SODIUM CHLORIDE 0.9 % IV BOLUS
1000.0000 mL | Freq: Once | INTRAVENOUS | Status: AC
  Administered 2019-12-29: 1000 mL via INTRAVENOUS

## 2019-12-29 MED ORDER — IOHEXOL 300 MG/ML  SOLN
100.0000 mL | Freq: Once | INTRAMUSCULAR | Status: AC | PRN
  Administered 2019-12-29: 100 mL via INTRAVENOUS

## 2019-12-29 MED ORDER — ONDANSETRON HCL 4 MG/2ML IJ SOLN
4.0000 mg | Freq: Once | INTRAMUSCULAR | Status: AC
  Administered 2019-12-29: 4 mg via INTRAVENOUS
  Filled 2019-12-29: qty 2

## 2019-12-29 NOTE — ED Notes (Signed)
ED Provider at bedside. 

## 2019-12-29 NOTE — ED Notes (Signed)
Pt to bathroom to provide urine specimen

## 2019-12-29 NOTE — ED Provider Notes (Signed)
[facility] EMERGENCY DEPARTMENT Provider Note   CSN: [id number] Arrival date & time: 12/29/19  7619     History Chief Complaint  Patient presents with  . Abdominal Pain    Patricia [name] is a 39 y.o. female presenting to the ED with abdominal pain and flank pain.  She reports onset of symptoms yesterday.  RLQ and right flank pain, worse with movement, as well as nausea and persistent vomiting.  Still passing gas.  No dysuria, hematuria, or vaginal discharge.  No respiratory symptoms, cough, congestion.  No fevers, chills.  No abdominal surgical hx.  She reports normal pap smear yearly.  She is aware of a right ovarian "cyst."  She is currently having regular monthly menstrual periods, but is not currently having an active menstrual cycle.    PSHx:  Tubal ligation  Per our records, she was treated on 11/29/2018 for trichomonas urethritis found on UA with Flagyl.  Her GC/Chlamydia test was negative at that time.  She has not been sexually active since then.  Her partner was also treated.  HPI     Past Medical History:  Diagnosis Date  . Migraine     Patient Active Problem List   Diagnosis Date Noted  . Malnutrition of moderate degree 11/01/2015  . Seizure-like activity ([place]) 11/01/2015  . Hypoglycemia 10/31/2015  . Dysphagia 10/31/2015  . Adjustment disorder with mixed anxiety and depressed mood 10/31/2015    Past Surgical History:  Procedure Laterality Date  . TUBAL LIGATION       OB History   No obstetric history on file.     Family History  Problem Relation Age of Onset  . Hypertension Other   . Anxiety disorder Mother   . Bipolar disorder Mother   . Anxiety disorder Maternal Grandmother   . Depression Maternal Grandmother   . Autism Son   . Seizures Son   . Diabetes Maternal Great-grandmother     Social History   Tobacco Use  . Smoking status: Never Smoker  . Smokeless tobacco: Never Used  Substance Use Topics  . Alcohol use: Yes   Comment: occ  . Drug use: No    Home Medications Prior to Admission medications   Medication Sig Start Date End Date Taking? Authorizing Provider  escitalopram (LEXAPRO) 10 MG tablet Take 1/2 tab po qd x 4-5 days, then increase to 1 tab po qd 01/18/19   Thayer Headings, PMHNP  methocarbamol (ROBAXIN) 500 MG tablet Take 1 tablet (500 mg total) by mouth 2 (two) times daily. Patient not taking: Reported on 01/18/2019 01/14/18   Glyn Ade, PA-C  naproxen (NAPROSYN) 500 MG tablet Take 1 tablet (500 mg total) by mouth 2 (two) times daily. Patient not taking: Reported on 01/18/2019 01/15/18   Shelda Pal, DO  propranolol (INDERAL) 10 MG tablet Take 1-2 tabs po BID prn anxiety 01/18/19   Thayer Headings, PMHNP    Allergies    Hydrocodone  Review of Systems   Review of Systems  Constitutional: Negative for chills and fever.  Respiratory: Negative for cough and shortness of breath.   Cardiovascular: Negative for chest pain and palpitations.  Gastrointestinal: Positive for abdominal pain, nausea and vomiting. Negative for diarrhea.  Genitourinary: Negative for difficulty urinating, dysuria, hematuria, menstrual problem, vaginal bleeding, vaginal discharge and vaginal pain.  Musculoskeletal: Positive for arthralgias and back pain.  Neurological: Negative for syncope and headaches.  Psychiatric/Behavioral: Negative for agitation and confusion.  All other systems reviewed and are negative.  Physical Exam Updated Vital Signs BP 118/63 (BP Location: Left Arm)   Pulse 78   Temp 98.2 F (36.8 C) (Oral)   Resp 16   Ht 5\' 1"  (1.549 m)   Wt 75.8 kg   SpO2 99%   BMI 31.55 kg/m   Physical Exam Vitals and nursing note reviewed.  Constitutional:      General: She is not in acute distress.    Appearance: She is well-developed.  HENT:     Head: Normocephalic and atraumatic.  Eyes:     Conjunctiva/sclera: Conjunctivae normal.  Cardiovascular:     Rate and Rhythm: Normal rate  and regular rhythm.     Heart sounds: Normal heart sounds.  Pulmonary:     Effort: Pulmonary effort is normal. No respiratory distress.     Breath sounds: Normal breath sounds.  Abdominal:     Palpations: Abdomen is soft.     Tenderness: There is abdominal tenderness in the right lower quadrant. There is right CVA tenderness. There is no left CVA tenderness, guarding or rebound. Positive signs include McBurney's sign.  Musculoskeletal:     Cervical back: Neck supple.     Lumbar back: Tenderness present.       Back:  Skin:    General: Skin is warm and dry.  Neurological:     General: No focal deficit present.     Mental Status: She is alert and oriented to person, place, and time.     ED Results / Procedures / Treatments   Labs (all labs ordered are listed, but only abnormal results are displayed) Labs Reviewed  URINALYSIS, ROUTINE W REFLEX MICROSCOPIC - Abnormal; Notable for the following components:      Result Value   Hgb urine dipstick TRACE (*)    All other components within normal limits  URINALYSIS, MICROSCOPIC (REFLEX) - Abnormal; Notable for the following components:   Bacteria, UA RARE (*)    All other components within normal limits  COMPREHENSIVE METABOLIC PANEL - Abnormal; Notable for the following components:   Calcium 8.5 (*)    All other components within normal limits  PREGNANCY, URINE  LIPASE, BLOOD  CBC WITH DIFFERENTIAL/PLATELET    EKG None  Radiology CT ABDOMEN PELVIS W CONTRAST  Result Date: 12/29/2019 CLINICAL DATA:  Nausea and vomiting since yesterday, right-sided flank pain, recent urinary tract infection EXAM: CT ABDOMEN AND PELVIS WITH CONTRAST TECHNIQUE: Multidetector CT imaging of the abdomen and pelvis was performed using the standard protocol following bolus administration of intravenous contrast. CONTRAST:  02/26/2020 OMNIPAQUE IOHEXOL 300 MG/ML  SOLN COMPARISON:  10/04/2017 FINDINGS: Lower chest: No acute pleural or parenchymal lung disease.  Hepatobiliary: No focal liver abnormality is seen. No gallstones, gallbladder wall thickening, or biliary dilatation. Pancreas: Unremarkable. No pancreatic ductal dilatation or surrounding inflammatory changes. Spleen: Normal in size without focal abnormality. Adrenals/Urinary Tract: Adrenal glands are unremarkable. The kidneys enhance normally and symmetrically. Minimal cortical scarring lower pole right kidney unchanged. No urinary tract calculi or obstructive uropathy. The bladder is unremarkable. Stomach/Bowel: No bowel obstruction or ileus. Normal gas-filled appendix right lower quadrant. Moderate stool throughout the colon. Vascular/Lymphatic: No significant vascular findings are present. No enlarged abdominal or pelvic lymph nodes. Reproductive: Uterus and bilateral adnexa are unremarkable. Other: No abdominal wall hernia or abnormality. No abdominopelvic ascites. Musculoskeletal: Prominent spondylosis at L5/S1 unchanged. Reconstructed images demonstrate no additional findings. IMPRESSION: 1. No acute intra-abdominal or intrapelvic process. Normal appendix. Electronically Signed   By: 13/09/2017 M.D.   On:  12/29/2019 10:41    Procedures Procedures (including critical care time)  Medications Ordered in ED Medications  sodium chloride 0.9 % bolus 1,000 mL (0 mLs Intravenous Stopped 12/29/19 1019)  ondansetron (ZOFRAN) injection 4 mg (4 mg Intravenous Given 12/29/19 0902)  iohexol (OMNIPAQUE) 300 MG/ML solution 100 mL (100 mLs Intravenous Contrast Given 12/29/19 1036)    ED Course  I have reviewed the triage vital signs and the nursing notes.  Pertinent labs & imaging results that were available during my care of the patient were reviewed by me and considered in my medical decision making (see chart for details).  39 yo female presenting with generalized right sided abdominal pain that wraps around to her backside.  She has mild diffuse tenderness with palpation of her right flank and paraspinal  muscles, and also reports some ttp near mcburney's point.  No rebound or guarding.  No RUQ ttp suggestive of cholecystitis.  Initial differential included appendicitis vs. Pyelonephritis vs cystitis vs constipation vs back spasms.  CT showed no acute infectious or inflammatory findings, which raises my suspicion for constipation or muscular etiology of her pain.  Her labs are reassuring.    Patient has no vaginal discharge or pelvic complaints.  She has not been sexually active since treatment for trichomonas in January (and her partner was treated).  She had negative GC/Chlamydia testing at that time.  I do not think she has a pelvic infection, and my suspicion for ovarian torsion or infection is low given her hx of tubal ligation, and her pain's reproducibility on abdominal and back exam.  Clinical Course as of Dec 29 1807  Thu Dec 29, 2019  1107 No leukocyotsis, no fever, CT without signs of pyelonephritis or appendicitis.  No ovarian enlargement - this is unlikely ToA or torsion given she had tubal ligation and there is no enlargement.  There is moderate stool in colon, may be some element of constipation here.  Discussed with patient her workup which does not show acute pathology, but I explained to her that this does not mean there isn't another medical cause for her symptoms.  I advised she f/u with PCP   [MT]    Clinical Course User Index [MT] Mayfield Schoene, Kermit Balo, MD    Final Clinical Impression(s) / ED Diagnoses Final diagnoses:  Generalized abdominal pain  Constipation, unspecified constipation type    Rx / DC Orders ED Discharge Orders    None       Terald Sleeper, MD 12/29/19 [phone]

## 2019-12-29 NOTE — Discharge Instructions (Signed)
Your bloodwork and CT scan today were reassuring.  We did not find any signs of acute emergencies like appendicitis, kidney infection, or bowel infection on your scans.  We do see that you appear to be constipated.  I recommended that you try over-the-counter laxatives at home for a bowel cleanout.  Drink plenty of water and try to add fiber to your diet.  Although our work-up today did not show any emergency causes for your pain, it still important that you follow-up with your primary care provider this week.  If you do not feel better after your bowel cleanout, your provider may wish to order additional testing.

## 2019-12-29 NOTE — ED Triage Notes (Signed)
Pt reports n/v since yesterday, right sided flank pain.  Recent treatment for UTI completed, denies fever, denies diarrhea.

## 2020-05-10 ENCOUNTER — Emergency Department (HOSPITAL_COMMUNITY)
Admission: EM | Admit: 2020-05-10 | Discharge: 2020-05-10 | Disposition: A | Discharge status: Home / Self Care | Payer: BLUE CROSS/BLUE SHIELD | Attending: Emergency Medicine | Admitting: Emergency Medicine

## 2020-05-10 ENCOUNTER — Other Ambulatory Visit: Payer: Self-pay

## 2020-05-10 ENCOUNTER — Encounter (HOSPITAL_COMMUNITY): Payer: Self-pay | Admitting: Emergency Medicine

## 2020-05-10 ENCOUNTER — Emergency Department (HOSPITAL_COMMUNITY): Payer: BLUE CROSS/BLUE SHIELD

## 2020-05-10 DIAGNOSIS — R112 Nausea with vomiting, unspecified: Secondary | ICD-10-CM | POA: Diagnosis not present

## 2020-05-10 DIAGNOSIS — R1011 Right upper quadrant pain: Secondary | ICD-10-CM | POA: Diagnosis present

## 2020-05-10 DIAGNOSIS — K5900 Constipation, unspecified: Secondary | ICD-10-CM | POA: Insufficient documentation

## 2020-05-10 DIAGNOSIS — R1031 Right lower quadrant pain: Secondary | ICD-10-CM

## 2020-05-10 LAB — COMPREHENSIVE METABOLIC PANEL
ALT: 12 U/L (ref 0–44)
AST: 20 U/L (ref 15–41)
Albumin: 3.9 g/dL (ref 3.5–5.0)
Alkaline Phosphatase: 62 U/L (ref 38–126)
Anion gap: 12 (ref 5–15)
BUN: <5 mg/dL — ABNORMAL LOW (ref 6–20)
CO2: 24 mmol/L (ref 22–32)
Calcium: 9.1 mg/dL (ref 8.9–10.3)
Chloride: 103 mmol/L (ref 98–111)
Creatinine, Ser: 0.92 mg/dL (ref 0.44–1.00)
GFR calc Af Amer: >60 mL/min (ref >60)
GFR calc non Af Amer: >60 mL/min (ref >60)
Glucose, Bld: 84 mg/dL (ref 70–99)
Potassium: 3.2 mmol/L — ABNORMAL LOW (ref 3.5–5.1)
Sodium: 139 mmol/L (ref 135–145)
Total Bilirubin: 0.6 mg/dL (ref 0.3–1.2)
Total Protein: 6.8 g/dL (ref 6.5–8.1)

## 2020-05-10 LAB — URINALYSIS, ROUTINE W REFLEX MICROSCOPIC
Bilirubin Urine: NEGATIVE
Glucose, UA: NEGATIVE mg/dL
Hgb urine dipstick: NEGATIVE
Ketones, ur: NEGATIVE mg/dL
Leukocytes,Ua: NEGATIVE
Nitrite: NEGATIVE
Protein, ur: NEGATIVE mg/dL
Specific Gravity, Urine: 1.006 (ref 1.005–1.030)
pH: 6 (ref 5.0–8.0)

## 2020-05-10 LAB — CBC
HCT: 41.6 % (ref 36.0–46.0)
Hemoglobin: 13.3 g/dL (ref 12.0–15.0)
MCH: 30 pg (ref 26.0–34.0)
MCHC: 32 g/dL (ref 30.0–36.0)
MCV: 93.7 fL (ref 80.0–100.0)
Platelets: 306 10*3/uL (ref 150–400)
RBC: 4.44 MIL/uL (ref 3.87–5.11)
RDW: 12 % (ref 11.5–15.5)
WBC: 7.8 10*3/uL (ref 4.0–10.5)
nRBC: 0 % (ref 0.0–0.2)

## 2020-05-10 LAB — I-STAT BETA HCG BLOOD, ED (MC, WL, AP ONLY): I-stat hCG, quantitative: <5 m[IU]/mL (ref <5)

## 2020-05-10 LAB — LIPASE, BLOOD: Lipase: 19 U/L (ref 11–51)

## 2020-05-10 MED ORDER — DICYCLOMINE HCL 20 MG PO TABS: 20.0000 mg | ORAL_TABLET | Freq: Three times a day (TID) | ORAL | 0 refills | Status: DC | PRN

## 2020-05-10 MED ORDER — POLYETHYLENE GLYCOL 3350 17 G PO PACK: 17.0000 g | PACK | Freq: Every day | ORAL | 0 refills | Status: DC

## 2020-05-10 MED ORDER — SODIUM CHLORIDE 0.9 % IV BOLUS
1000.0000 mL | Freq: Once | INTRAVENOUS | Status: AC
  Administered 2020-05-10: 1000 mL via INTRAVENOUS

## 2020-05-10 MED ORDER — GADOBUTROL 1 MMOL/ML IV SOLN
7.4000 mL | Freq: Once | INTRAVENOUS | Status: AC | PRN
  Administered 2020-05-10: 7.4 mL via INTRAVENOUS

## 2020-05-10 MED ORDER — ONDANSETRON 4 MG PO TBDP
4.0000 mg | ORAL_TABLET | Freq: Once | ORAL | Status: AC | PRN
  Administered 2020-05-10: 4 mg via ORAL
  Filled 2020-05-10: qty 1

## 2020-05-10 MED ORDER — SODIUM CHLORIDE 0.9% FLUSH: 3.0000 mL | Freq: Once | INTRAVENOUS | Status: DC

## 2020-05-10 MED ORDER — FENTANYL CITRATE (PF) 100 MCG/2ML IJ SOLN
50.0000 ug | INTRAMUSCULAR | Status: DC | PRN
  Administered 2020-05-10: 50 ug via INTRAVENOUS
  Filled 2020-05-10: qty 2

## 2020-05-10 NOTE — ED Triage Notes (Addendum)
Pt presents to ED POV. Pt states that she has had RLQ pain for a few days. Pt tachycardia in triage - 128.  Pt c/o n/v.

## 2020-05-10 NOTE — ED Provider Notes (Signed)
Emergency Department Provider Note   I have reviewed the triage vital signs and the nursing notes.   HISTORY  Chief Complaint Abdominal Pain   HPI Patricia [name] is a 39 y.o. female with past medical history reviewed below presents to the emergency department with several days of right-sided abdominal pain.  Patient feels the pain mostly in the right lower quadrant radiating to the groin.  She initially attributed this to early menstrual cramping the pain has gradually gotten worse.  Pain is now radiating into the right upper quadrant.  She has nausea and vomiting with eating solid foods.  She reports not having a bowel movement or passing flatus in the past several days.  She denies any fever or shaking chills.  She is not having any vaginal bleeding or discharge.  Her last menstrual cycle was 1 month ago.  No dysuria, hesitancy, urgency. Only surgical history is a tubal ligation.    Past Medical History:  Diagnosis Date  . Migraine     Patient Active Problem List   Diagnosis Date Noted  . Malnutrition of moderate degree 11/01/2015  . Seizure-like activity ([place]) 11/01/2015  . Hypoglycemia 10/31/2015  . Dysphagia 10/31/2015  . Adjustment disorder with mixed anxiety and depressed mood 10/31/2015    Past Surgical History:  Procedure Laterality Date  . TUBAL LIGATION      Allergies Hydrocodone  Family History  Problem Relation Age of Onset  . Hypertension Other   . Anxiety disorder Mother   . Bipolar disorder Mother   . Anxiety disorder Maternal Grandmother   . Depression Maternal Grandmother   . Autism Son   . Seizures Son   . Diabetes Maternal Great-grandmother     Social History Social History   Tobacco Use  . Smoking status: Never Smoker  . Smokeless tobacco: Never Used  Vaping Use  . Vaping Use: Never used  Substance Use Topics  . Alcohol use: Yes    Comment: occ  . Drug use: No    Review of Systems  Constitutional: No fever/chills Eyes: No  visual changes. ENT: No sore throat. Cardiovascular: Denies chest pain. Respiratory: Denies shortness of breath. Gastrointestinal: Positive RLQ abdominal pain. Positive nausea and vomiting.  No diarrhea.  No constipation. Genitourinary: Negative for dysuria. Musculoskeletal: Negative for back pain. Skin: Negative for rash. Neurological: Negative for headaches, focal weakness or numbness.  10-point ROS otherwise negative.  ____________________________________________   PHYSICAL EXAM:  VITAL SIGNS: ED Triage Vitals  Enc Vitals Group     BP 05/10/20 0625 (!) 138/99     Pulse Rate 05/10/20 0625 (!) 128     Resp 05/10/20 0625 18     Temp 05/10/20 0625 99.5 F (37.5 C)     Temp Source 05/10/20 0625 Oral     SpO2 05/10/20 0625 100 %     Weight 05/10/20 0626 165 lb (74.8 kg)     Height 05/10/20 0626 5\' 1"  (1.549 m)   Constitutional: Alert and oriented. Well appearing and in no acute distress. Eyes: Conjunctivae are normal.  Head: Atraumatic. Nose: No congestion/rhinnorhea. Mouth/Throat: Mucous membranes are moist.   Neck: No stridor.   Cardiovascular: Tachycardia. Good peripheral circulation. Grossly normal heart sounds.   Respiratory: Normal respiratory effort.  No retractions. Lungs CTAB. Gastrointestinal: Soft with focal RUQ and RLQ tenderness. No rebound or guarding. No distention.  Musculoskeletal: No lower extremity tenderness nor edema. No gross deformities of extremities. Neurologic:  Normal speech and language. No gross focal neurologic deficits  are appreciated.  Skin:  Skin is warm, dry and intact. No rash noted.  ____________________________________________   LABS (all labs ordered are listed, but only abnormal results are displayed)  Labs Reviewed  URINALYSIS, ROUTINE W REFLEX MICROSCOPIC - Abnormal; Notable for the following components:      Result Value   Color, Urine STRAW (*)    All other components within normal limits  CBC  LIPASE, BLOOD  COMPREHENSIVE  METABOLIC PANEL  I-STAT BETA HCG BLOOD, ED ([facility], [facility], [facility] ONLY)   ____________________________________________  RADIOLOGY  RUQ [place], Acute abdomen plain film, and MRCP results reviewed.  ____________________________________________   PROCEDURES  Procedure(s) performed:   Procedures  None  ____________________________________________   INITIAL IMPRESSION / ASSESSMENT AND PLAN / ED COURSE  Pertinent labs & imaging results that were available during my care of the patient were reviewed by me and considered in my medical decision making (see chart for details).   Patient presents to the emergency department with right-sided abdominal pain.  She has focal tenderness in the right upper and right lower quadrant with vomiting. Mild distension. Low suspicion for SBO. Ovarian pathology such as torsion is on my differential but patient is also tender in the RUQ.   Discussed RUQ [place] with GI on call. Recommends MRCP which was performed in the ED. No acute findings. Labs reviewed. Patient feeling improved and tolerating PO. Doubt partial or full SBO but discussed strict return precautions. Patient is comfortable with the plan at discharge.  ____________________________________________  FINAL CLINICAL IMPRESSION(S) / ED DIAGNOSES  Final diagnoses:  RUQ abdominal pain     MEDICATIONS GIVEN DURING THIS VISIT:  Medications  sodium chloride flush (NS) 0.9 % injection 3 mL (has no administration in time range)  ondansetron (ZOFRAN-ODT) disintegrating tablet 4 mg (has no administration in time range)  fentaNYL (SUBLIMAZE) injection 50 mcg (has no administration in time range)  sodium chloride 0.9 % bolus 1,000 mL (has no administration in time range)    Note:  This document was prepared using Dragon voice recognition software and may include unintentional dictation errors.  Alona Bene, MD, [facility] Emergency [facility]    Gualberto Wahlen, Arlyss Repress, MD 05/13/20 [phone]

## 2020-05-10 NOTE — Discharge Instructions (Signed)

## 2020-06-20 ENCOUNTER — Emergency Department (HOSPITAL_BASED_OUTPATIENT_CLINIC_OR_DEPARTMENT_OTHER)
Admission: EM | Admit: 2020-06-20 | Discharge: 2020-06-20 | Disposition: A | Discharge status: Home / Self Care | Payer: BLUE CROSS/BLUE SHIELD | Attending: Emergency Medicine | Admitting: Emergency Medicine

## 2020-06-20 ENCOUNTER — Other Ambulatory Visit: Payer: Self-pay

## 2020-06-20 ENCOUNTER — Emergency Department (HOSPITAL_BASED_OUTPATIENT_CLINIC_OR_DEPARTMENT_OTHER): Payer: BLUE CROSS/BLUE SHIELD

## 2020-06-20 ENCOUNTER — Encounter (HOSPITAL_BASED_OUTPATIENT_CLINIC_OR_DEPARTMENT_OTHER): Payer: Self-pay | Admitting: Emergency Medicine

## 2020-06-20 DIAGNOSIS — R300 Dysuria: Secondary | ICD-10-CM | POA: Diagnosis not present

## 2020-06-20 DIAGNOSIS — R3915 Urgency of urination: Secondary | ICD-10-CM | POA: Diagnosis not present

## 2020-06-20 DIAGNOSIS — M549 Dorsalgia, unspecified: Secondary | ICD-10-CM | POA: Diagnosis not present

## 2020-06-20 DIAGNOSIS — R319 Hematuria, unspecified: Secondary | ICD-10-CM | POA: Insufficient documentation

## 2020-06-20 DIAGNOSIS — R630 Anorexia: Secondary | ICD-10-CM | POA: Diagnosis not present

## 2020-06-20 DIAGNOSIS — R000 Tachycardia, unspecified: Secondary | ICD-10-CM | POA: Insufficient documentation

## 2020-06-20 DIAGNOSIS — R109 Unspecified abdominal pain: Secondary | ICD-10-CM | POA: Insufficient documentation

## 2020-06-20 DIAGNOSIS — A599 Trichomoniasis, unspecified: Secondary | ICD-10-CM | POA: Diagnosis not present

## 2020-06-20 DIAGNOSIS — N83201 Unspecified ovarian cyst, right side: Secondary | ICD-10-CM

## 2020-06-20 LAB — CBC WITH DIFFERENTIAL/PLATELET
Abs Immature Granulocytes: 0.02 10*3/uL (ref 0.00–0.07)
Basophils Absolute: 0.1 10*3/uL (ref 0.0–0.1)
Basophils Relative: 1 %
Eosinophils Absolute: 0.1 10*3/uL (ref 0.0–0.5)
Eosinophils Relative: 1 %
HCT: 40.8 % (ref 36.0–46.0)
Hemoglobin: 13.2 g/dL (ref 12.0–15.0)
Immature Granulocytes: 0 %
Lymphocytes Relative: 43 %
Lymphs Abs: 2.6 10*3/uL (ref 0.7–4.0)
MCH: 29.8 pg (ref 26.0–34.0)
MCHC: 32.4 g/dL (ref 30.0–36.0)
MCV: 92.1 fL (ref 80.0–100.0)
Monocytes Absolute: 0.5 10*3/uL (ref 0.1–1.0)
Monocytes Relative: 8 %
Neutro Abs: 2.9 10*3/uL (ref 1.7–7.7)
Neutrophils Relative %: 47 %
Platelets: 308 10*3/uL (ref 150–400)
RBC: 4.43 MIL/uL (ref 3.87–5.11)
RDW: 12.2 % (ref 11.5–15.5)
WBC: 6.2 10*3/uL (ref 4.0–10.5)
nRBC: 0 % (ref 0.0–0.2)

## 2020-06-20 LAB — URINALYSIS, ROUTINE W REFLEX MICROSCOPIC
Bilirubin Urine: NEGATIVE
Glucose, UA: NEGATIVE mg/dL
Hgb urine dipstick: NEGATIVE
Ketones, ur: NEGATIVE mg/dL
Leukocytes,Ua: NEGATIVE
Nitrite: NEGATIVE
Protein, ur: NEGATIVE mg/dL
Specific Gravity, Urine: 1.01 (ref 1.005–1.030)
pH: 7 (ref 5.0–8.0)

## 2020-06-20 LAB — COMPREHENSIVE METABOLIC PANEL
ALT: 14 U/L (ref 0–44)
AST: 17 U/L (ref 15–41)
Albumin: 4.1 g/dL (ref 3.5–5.0)
Alkaline Phosphatase: 61 U/L (ref 38–126)
Anion gap: 13 (ref 5–15)
BUN: 5 mg/dL — ABNORMAL LOW (ref 6–20)
CO2: 25 mmol/L (ref 22–32)
Calcium: 8.9 mg/dL (ref 8.9–10.3)
Chloride: 101 mmol/L (ref 98–111)
Creatinine, Ser: 0.89 mg/dL (ref 0.44–1.00)
GFR calc Af Amer: >60 mL/min (ref >60)
GFR calc non Af Amer: >60 mL/min (ref >60)
Glucose, Bld: 91 mg/dL (ref 70–99)
Potassium: 3.5 mmol/L (ref 3.5–5.1)
Sodium: 139 mmol/L (ref 135–145)
Total Bilirubin: 0.4 mg/dL (ref 0.3–1.2)
Total Protein: 7.6 g/dL (ref 6.5–8.1)

## 2020-06-20 LAB — WET PREP, GENITAL
Clue Cells Wet Prep HPF POC: NONE SEEN
Sperm: NONE SEEN
Yeast Wet Prep HPF POC: NONE SEEN

## 2020-06-20 LAB — PREGNANCY, URINE: Preg Test, Ur: NEGATIVE

## 2020-06-20 LAB — LIPASE, BLOOD: Lipase: 22 U/L (ref 11–51)

## 2020-06-20 MED ORDER — IOHEXOL 300 MG/ML  SOLN
100.0000 mL | Freq: Once | INTRAMUSCULAR | Status: AC | PRN
  Administered 2020-06-20: 100 mL via INTRAVENOUS

## 2020-06-20 MED ORDER — METRONIDAZOLE 500 MG PO TABS
2000.0000 mg | ORAL_TABLET | Freq: Once | ORAL | Status: AC
  Administered 2020-06-20: 2000 mg via ORAL
  Filled 2020-06-20: qty 4

## 2020-06-20 MED ORDER — ACETAMINOPHEN 325 MG PO TABS
650.0000 mg | ORAL_TABLET | Freq: Once | ORAL | Status: AC
  Administered 2020-06-20: 650 mg via ORAL
  Filled 2020-06-20: qty 2

## 2020-06-20 MED ORDER — SODIUM CHLORIDE 0.9 % IV BOLUS
1000.0000 mL | Freq: Once | INTRAVENOUS | Status: AC
  Administered 2020-06-20: 1000 mL via INTRAVENOUS

## 2020-06-20 NOTE — ED Provider Notes (Signed)
[facility] EMERGENCY DEPARTMENT Provider Note   CSN: [id number] Arrival date & time: 06/20/20  0451     History Chief Complaint  Patient presents with  . Flank Pain    Patricia [name] is a 39 y.o. female.  Patient states she has been having right-sided flank and abdominal pain for the better part of 2 months.  She has the pain constantly and never goes away.  She has not taken thing for it at home.  She admits to poor appetite but no nausea or vomiting.  No change in bowel habits.  Today she became concerned because she had worsening pain on her side of her abdomen and noticed what she thought was blood in her urine.  She describes "membranes" in the toilet bowl.  Does not think this is vaginal or rectal bleeding.  Does have some pain with urination.  No fever, chills, nausea or vomiting.  No chest pain or shortness of breath.  No previous abdominal surgeries other than tubal ligation.  She has not seen a doctor regarding her right-sided abdominal pain which is been ongoing for many months.  Still has appendix and gallbladder.  States the pain is the same as always but the blood in the urine is new today.  The history is provided by the patient.  Flank Pain Associated symptoms include abdominal pain. Pertinent negatives include no chest pain, no headaches and no shortness of breath.       Past Medical History:  Diagnosis Date  . Migraine     Patient Active Problem List   Diagnosis Date Noted  . Malnutrition of moderate degree 11/01/2015  . Seizure-like activity (HCC) 11/01/2015  . Hypoglycemia 10/31/2015  . Dysphagia 10/31/2015  . Adjustment disorder with mixed anxiety and depressed mood 10/31/2015    Past Surgical History:  Procedure Laterality Date  . TUBAL LIGATION       OB History   No obstetric history on file.     Family History  Problem Relation Age of Onset  . Hypertension Other   . Anxiety disorder Mother   . Bipolar disorder Mother   .  Anxiety disorder Maternal Grandmother   . Depression Maternal Grandmother   . Autism Son   . Seizures Son   . Diabetes Maternal Great-grandmother     Social History   Tobacco Use  . Smoking status: Never Smoker  . Smokeless tobacco: Never Used  Vaping Use  . Vaping Use: Never used  Substance Use Topics  . Alcohol use: Yes    Comment: occ  . Drug use: No    Home Medications Prior to Admission medications   Medication Sig Start Date End Date Taking? Authorizing Provider  dicyclomine (BENTYL) 20 MG tablet Take 1 tablet (20 mg total) by mouth 3 (three) times daily as needed for spasms. 05/10/20   Long, Arlyss Repress, MD  diphenhydrAMINE (BENADRYL) 25 mg capsule Take 25 mg by mouth every 6 (six) hours as needed for allergies.     [provider]  escitalopram (LEXAPRO) 10 MG tablet Take 1/2 tab po qd x 4-5 days, then increase to 1 tab po qd Patient not taking: Reported on 05/10/2020 01/18/19   Corie Chiquito, PMHNP  methocarbamol (ROBAXIN) 500 MG tablet Take 1 tablet (500 mg total) by mouth 2 (two) times daily. Patient not taking: Reported on 01/18/2019 01/14/18   Kellie Shropshire, PA-C  naproxen (NAPROSYN) 500 MG tablet Take 1 tablet (500 mg total) by mouth 2 (two) times  daily. Patient not taking: Reported on 01/18/2019 01/15/18   Sharlene Dory, DO  ondansetron (ZOFRAN-ODT) 4 MG disintegrating tablet Take 4 mg by mouth every 8 (eight) hours as needed for nausea.    [provider]  oxyCODONE-acetaminophen (PERCOCET) 10-325 MG tablet Take 1 tablet by mouth as needed for pain. 04/16/20   [provider]  polyethylene glycol (MIRALAX) 17 g packet Take 17 g by mouth daily. 05/10/20   Long, Arlyss Repress, MD  propranolol (INDERAL) 10 MG tablet Take 1-2 tabs po BID prn anxiety Patient not taking: Reported on 05/10/2020 01/18/19   Corie Chiquito, PMHNP    Allergies    Hydrocodone  Review of Systems   Review of Systems  Constitutional: Positive for activity change  and appetite change. Negative for fatigue and fever.  HENT: Negative for congestion and rhinorrhea.   Eyes: Negative for visual disturbance.  Respiratory: Negative for cough, chest tightness and shortness of breath.   Cardiovascular: Negative for chest pain.  Gastrointestinal: Positive for abdominal pain. Negative for nausea and vomiting.  Genitourinary: Positive for dysuria, flank pain, hematuria, pelvic pain and urgency. Negative for difficulty urinating, vaginal bleeding and vaginal discharge.  Musculoskeletal: Positive for back pain.  Neurological: Negative for dizziness, weakness and headaches.   all other systems are negative except as noted in the HPI and PMH.    Physical Exam Updated Vital Signs BP (!) 137/113 (BP Location: Left Arm)   Pulse (!) 119   Temp 99 F (37.2 C) (Oral)   Resp 18   Ht 5\' 1"  (1.549 m)   Wt 71.2 kg   LMP 06/07/2020 (Exact Date)   SpO2 100%   BMI 29.66 kg/m   Physical Exam Vitals and nursing note reviewed.  Constitutional:      General: She is not in acute distress.    Appearance: Normal appearance. She is well-developed and normal weight. She is not ill-appearing.  HENT:     Head: Normocephalic and atraumatic.     Mouth/Throat:     Pharynx: No oropharyngeal exudate.  Eyes:     Conjunctiva/sclera: Conjunctivae normal.     Pupils: Pupils are equal, round, and reactive to light.  Neck:     Comments: No meningismus. Cardiovascular:     Rate and Rhythm: Regular rhythm. Tachycardia present.     Heart sounds: Normal heart sounds. No murmur heard.   Pulmonary:     Effort: Pulmonary effort is normal. No respiratory distress.     Breath sounds: Normal breath sounds.  Abdominal:     Palpations: Abdomen is soft.     Tenderness: There is abdominal tenderness. There is no guarding or rebound.     Comments: Right upper and lower quadrant tenderness with voluntary guarding.  No left-sided abdominal tenderness  Genitourinary:    Comments: Chaperone  present.  Normal external genitalia.  White discharge in vaginal vault.  No CMT.  There is right side adnexal tenderness.  No midline tenderness. Musculoskeletal:        General: Tenderness present. Normal range of motion.     Cervical back: Normal range of motion and neck supple.     Comments: Right CVA tenderness  5/5 strength in bilateral lower extremities. Ankle plantar and dorsiflexion intact. Great toe extension intact bilaterally. +2 DP and PT pulses. +2 patellar reflexes bilaterally. Normal gait.   Skin:    General: Skin is warm.     Capillary Refill: Capillary refill takes less than 2 seconds.  Neurological:  General: No focal deficit present.     Mental Status: She is alert and oriented to person, place, and time. Mental status is at baseline.     Cranial Nerves: No cranial nerve deficit.     Motor: No abnormal muscle tone.     Coordination: Coordination normal.     Comments:  5/5 strength throughout. CN 2-12 intact.Equal grip strength.   Psychiatric:        Behavior: Behavior normal.     ED Results / Procedures / Treatments   Labs (all labs ordered are listed, but only abnormal results are displayed) Labs Reviewed  WET PREP, GENITAL - Abnormal; Notable for the following components:      Result Value   Trich, Wet Prep PRESENT (*)    WBC, Wet Prep HPF POC MANY (*)    All other components within normal limits  COMPREHENSIVE METABOLIC PANEL - Abnormal; Notable for the following components:   BUN 5 (*)    All other components within normal limits  URINALYSIS, ROUTINE W REFLEX MICROSCOPIC  PREGNANCY, URINE  CBC WITH DIFFERENTIAL/PLATELET  LIPASE, BLOOD  GC/CHLAMYDIA PROBE AMP ([place]) NOT AT [facility]    EKG None  Radiology No results found.  Procedures Procedures (including critical care time)  Medications Ordered in ED Medications - No data to display  ED Course  I have reviewed the triage vital signs and the nursing notes.  Pertinent labs & imaging  results that were available during my care of the patient were reviewed by me and considered in my medical decision making (see chart for details).    MDM Rules/Calculators/A&P                          Several months of right-sided abdominal pain now with apparent blood in the urine.  She is tachycardic on arrival.  Abdomen without peritoneal signs.  Urinalysis is negative.  hCG is negative.  No hematuria or infection.  Chart review shows patient was seen in the ED in May 10, 2020 for right-sided abdominal pain.  She had a normal right upper quadrant ultrasound at that time as well as a pelvic ultrasound.  She underwent MRCP due to apparent CBD dilation but this was found to be normal variant.  This was discussed with the patient.  She states the pain she is having today is the same as what she had in June.  I has never really gone away since then but has waxed and waned in severity.  Labs reassuring.  Trichomonas noted on wet prep and patient informed of same.  Informed that her partner needs to be treated as well. We will proceed with CT imaging given her ongoing abdominal discomfort. There is no apparent evidence of the urine or vaginal bleeding at this time.  Patient informed of her trichomonas and need for treatment of herself and her partner.  CT imaging is pending at time of shift change.  Low suspicion for acute surgical pathology.  Patient may benefit from GI referral and follow-up.  Care transferred at shift change with CT scan pending.  Her initial tachycardia has resolved.  She continues to decline pain medication. Final Clinical Impression(s) / ED Diagnoses Final diagnoses:  Right sided abdominal pain  Trichomoniasis    Rx / DC Orders ED Discharge Orders    None       Euva Rundell, Jeannett Senior, MD 06/20/20 [id number]

## 2020-06-20 NOTE — ED Provider Notes (Signed)
Patient signed out pending CT scan abdomen pelvis. Right-sided abdominal pain upper and lower for the last several weeks worse over the last 24 hours. Positive for trichomonas and treated. Otherwise lab work unremarkable. Previous provider did not feel like patient had PID.  CT scan shows a simple cyst of the right ovary, this is 4.9 x 3.8 cm. No inflammation seen around this area however will get an ultrasound to further evaluate for torsion. She also has constipation seen on CT scan. It appears that she has multiple reasons for pain including constipation, trichomonas. She has had some fairly persistent pain today. However, upon my evaluation she feels better.  Ultrasound shows stable ovarian cyst.  Otherwise unremarkable.  Normal blood flow to the ovaries.  Symptoms do not appear consistent with torsion as they have been ongoing for several weeks.  Suspect a combination of constipation/trichomonas causing discomfort.  Understands return precautions and discharged in ED in good condition.  Recommend MiraLAX.  This chart was dictated using voice recognition software.  Despite best efforts to proofread,  errors can occur which can change the documentation meaning.     Virgina Norfolk, DO 06/20/20 [phone]

## 2020-06-20 NOTE — ED Triage Notes (Signed)
Pt states she has been having right flank pain for about 2.5 months  Pt has been seen at [facility] for same  Pt states tonight she started having blood in her urine  Pt has nausea without vomiting

## 2020-06-20 NOTE — Discharge Instructions (Addendum)
  You were treated for trichomonas today which is a sexually transmitted disease.  Your partner should be treated as well.  Recommend using over-the-counter MiraLAX for constipation.

## 2020-06-20 NOTE — ED Notes (Signed)
Pt to CT

## 2020-06-21 LAB — GC/CHLAMYDIA PROBE AMP (~~LOC~~) NOT AT ARMC
Chlamydia: NEGATIVE
Comment: NEGATIVE
Comment: NORMAL
Neisseria Gonorrhea: NEGATIVE

## 2020-08-07 ENCOUNTER — Emergency Department (HOSPITAL_COMMUNITY)
Admission: EM | Admit: 2020-08-07 | Discharge: 2020-08-07 | Discharge status: Absent without leave | Payer: BLUE CROSS/BLUE SHIELD

## 2020-08-27 ENCOUNTER — Other Ambulatory Visit: Payer: Self-pay

## 2020-08-27 ENCOUNTER — Encounter (HOSPITAL_BASED_OUTPATIENT_CLINIC_OR_DEPARTMENT_OTHER): Payer: Self-pay | Admitting: *Deleted

## 2020-08-27 DIAGNOSIS — Y93I9 Activity, other involving external motion: Secondary | ICD-10-CM | POA: Insufficient documentation

## 2020-08-27 DIAGNOSIS — Y92410 Unspecified street and highway as the place of occurrence of the external cause: Secondary | ICD-10-CM | POA: Insufficient documentation

## 2020-08-27 DIAGNOSIS — S299XXA Unspecified injury of thorax, initial encounter: Secondary | ICD-10-CM | POA: Diagnosis present

## 2020-08-27 DIAGNOSIS — S29012A Strain of muscle and tendon of back wall of thorax, initial encounter: Secondary | ICD-10-CM | POA: Insufficient documentation
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
## 2020-08-28 ENCOUNTER — Emergency Department (HOSPITAL_BASED_OUTPATIENT_CLINIC_OR_DEPARTMENT_OTHER)
Admission: EM | Admit: 2020-08-28 | Discharge: 2020-08-28 | Disposition: A | Discharge status: Home / Self Care | Payer: BLUE CROSS/BLUE SHIELD | Attending: Emergency Medicine | Admitting: Emergency Medicine

## 2020-08-28 DIAGNOSIS — T148XXA Other injury of unspecified body region, initial encounter: Secondary | ICD-10-CM
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
## 2020-08-28 NOTE — Discharge Instructions (Addendum)
You may use over-the-counter Motrin (Ibuprofen), Acetaminophen (Tylenol), topical muscle creams such as SalonPas, Icy Hot, Bengay, etc. Please stretch, apply heat, and have massage therapy for additional assistance. ° °

## 2020-08-29 ENCOUNTER — Other Ambulatory Visit: Payer: Self-pay

## 2020-08-29 ENCOUNTER — Encounter (HOSPITAL_BASED_OUTPATIENT_CLINIC_OR_DEPARTMENT_OTHER): Payer: Self-pay | Admitting: Emergency Medicine

## 2020-08-29 ENCOUNTER — Emergency Department (HOSPITAL_BASED_OUTPATIENT_CLINIC_OR_DEPARTMENT_OTHER)
Admission: EM | Admit: 2020-08-29 | Discharge: 2020-08-29 | Disposition: A | Discharge status: Home / Self Care | Payer: BLUE CROSS/BLUE SHIELD | Attending: Emergency Medicine | Admitting: Emergency Medicine

## 2020-08-29 DIAGNOSIS — S29012D Strain of muscle and tendon of back wall of thorax, subsequent encounter: Secondary | ICD-10-CM | POA: Diagnosis not present

## 2020-08-29 DIAGNOSIS — T148XXA Other injury of unspecified body region, initial encounter: Secondary | ICD-10-CM

## 2020-08-29 DIAGNOSIS — S29002D Unspecified injury of muscle and tendon of back wall of thorax, subsequent encounter: Secondary | ICD-10-CM | POA: Diagnosis present

## 2020-08-29 MED ORDER — ONDANSETRON 4 MG PO TBDP: 4.0000 mg | ORAL_TABLET | Freq: Three times a day (TID) | ORAL | 0 refills | Status: AC | PRN

## 2020-08-29 MED ORDER — KETOROLAC TROMETHAMINE 60 MG/2ML IM SOLN
30.0000 mg | Freq: Once | INTRAMUSCULAR | Status: AC
  Administered 2020-08-29: 30 mg via INTRAMUSCULAR
  Filled 2020-08-29: qty 2
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
## 2020-09-02 ENCOUNTER — Other Ambulatory Visit: Payer: Self-pay

## 2020-09-02 ENCOUNTER — Emergency Department (HOSPITAL_COMMUNITY)
Admission: EM | Admit: 2020-09-02 | Discharge: 2020-09-02 | Disposition: A | Discharge status: Home / Self Care | Payer: BLUE CROSS/BLUE SHIELD | Attending: Emergency Medicine | Admitting: Emergency Medicine

## 2020-09-02 ENCOUNTER — Emergency Department (HOSPITAL_COMMUNITY): Payer: BLUE CROSS/BLUE SHIELD

## 2020-09-02 ENCOUNTER — Encounter (HOSPITAL_COMMUNITY): Payer: Self-pay | Admitting: Emergency Medicine

## 2020-09-02 DIAGNOSIS — Y92410 Unspecified street and highway as the place of occurrence of the external cause: Secondary | ICD-10-CM | POA: Insufficient documentation

## 2020-09-02 DIAGNOSIS — R079 Chest pain, unspecified: Secondary | ICD-10-CM | POA: Diagnosis present

## 2020-09-02 DIAGNOSIS — Y9389 Activity, other specified: Secondary | ICD-10-CM | POA: Insufficient documentation

## 2020-09-02 MED ORDER — KETOROLAC TROMETHAMINE 60 MG/2ML IM SOLN
60.0000 mg | Freq: Once | INTRAMUSCULAR | Status: AC
  Administered 2020-09-02: 60 mg via INTRAMUSCULAR
  Filled 2020-09-02: qty 2
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
## 2020-09-13 ENCOUNTER — Encounter (HOSPITAL_COMMUNITY): Payer: Self-pay | Admitting: *Deleted

## 2020-09-13 ENCOUNTER — Emergency Department (HOSPITAL_COMMUNITY)
Admission: EM | Admit: 2020-09-13 | Discharge: 2020-09-14 | Disposition: A | Discharge status: Home / Self Care | Payer: BLUE CROSS/BLUE SHIELD | Attending: Emergency Medicine | Admitting: Emergency Medicine

## 2020-09-13 ENCOUNTER — Other Ambulatory Visit: Payer: Self-pay

## 2020-09-13 DIAGNOSIS — L03311 Cellulitis of abdominal wall: Secondary | ICD-10-CM | POA: Insufficient documentation

## 2020-09-13 DIAGNOSIS — R21 Rash and other nonspecific skin eruption: Secondary | ICD-10-CM | POA: Diagnosis present

## 2020-09-13 DIAGNOSIS — B029 Zoster without complications: Secondary | ICD-10-CM | POA: Insufficient documentation

## 2020-09-13 NOTE — ED Triage Notes (Signed)
Pt has been having pain in right side of her lower abdomen radiating to back.  Pt now has deep red skin which is warm to the touch and painful and would like to be re-evaluated.

## 2020-09-14 LAB — CBC
HCT: 40.4 % (ref 36.0–46.0)
Hemoglobin: 13 g/dL (ref 12.0–15.0)
MCH: 30.7 pg (ref 26.0–34.0)
MCHC: 32.2 g/dL (ref 30.0–36.0)
MCV: 95.5 fL (ref 80.0–100.0)
Platelets: 353 10*3/uL (ref 150–400)
RBC: 4.23 MIL/uL (ref 3.87–5.11)
RDW: 12.2 % (ref 11.5–15.5)
WBC: 5.4 10*3/uL (ref 4.0–10.5)
nRBC: 0 % (ref 0.0–0.2)

## 2020-09-14 LAB — BASIC METABOLIC PANEL
Anion gap: 8 (ref 5–15)
BUN: 5 mg/dL — ABNORMAL LOW (ref 6–20)
CO2: 23 mmol/L (ref 22–32)
Calcium: 8.6 mg/dL — ABNORMAL LOW (ref 8.9–10.3)
Chloride: 107 mmol/L (ref 98–111)
Creatinine, Ser: 0.9 mg/dL (ref 0.44–1.00)
GFR, Estimated: >60 mL/min (ref >60)
Glucose, Bld: 101 mg/dL — ABNORMAL HIGH (ref 70–99)
Potassium: 3.7 mmol/L (ref 3.5–5.1)
Sodium: 138 mmol/L (ref 135–145)

## 2020-09-14 MED ORDER — OXYCODONE-ACETAMINOPHEN 5-325 MG PO TABS: 1.0000 | ORAL_TABLET | ORAL | 0 refills | Status: DC | PRN

## 2020-09-14 MED ORDER — VALACYCLOVIR HCL 1 G PO TABS
1000.0000 mg | ORAL_TABLET | Freq: Three times a day (TID) | ORAL | 0 refills | Status: DC
Start: 2020-09-14 — End: 2023-01-06

## 2020-09-14 MED ORDER — DOXYCYCLINE HYCLATE 100 MG PO CAPS
100.0000 mg | ORAL_CAPSULE | Freq: Two times a day (BID) | ORAL | 0 refills | Status: DC
Start: 2020-09-14 — End: 2023-12-17

## 2020-09-14 NOTE — ED Provider Notes (Signed)
[facility] EMERGENCY DEPARTMENT Provider Note   CSN: [id number] Arrival date & time: 09/13/20  2217     History Chief Complaint  Patient presents with  . Rash    Patricia [name] is a 39 y.o. female.  Patient presents to the emergency department for a painful rash on the right side of her abdomen that has been present for 2 days.  Patient reports that it starts at the back and wraps around the right side.  She has tried icing the area and using topical lidocaine without improvement.  Patient reports a dull aching pain like a toothache.  The area is very tender to touch.        Past Medical History:  Diagnosis Date  . Migraine     Patient Active Problem List   Diagnosis Date Noted  . Malnutrition of moderate degree 11/01/2015  . Seizure-like activity (HCC) 11/01/2015  . Hypoglycemia 10/31/2015  . Dysphagia 10/31/2015  . Adjustment disorder with mixed anxiety and depressed mood 10/31/2015    Past Surgical History:  Procedure Laterality Date  . TUBAL LIGATION       OB History   No obstetric history on file.     Family History  Problem Relation Age of Onset  . Hypertension Other   . Anxiety disorder Mother   . Bipolar disorder Mother   . Anxiety disorder Maternal Grandmother   . Depression Maternal Grandmother   . Autism Son   . Seizures Son   . Diabetes Maternal Great-grandmother     Social History   Tobacco Use  . Smoking status: Never Smoker  . Smokeless tobacco: Never Used  Vaping Use  . Vaping Use: Never used  Substance Use Topics  . Alcohol use: Yes    Comment: occ  . Drug use: No    Home Medications Prior to Admission medications   Medication Sig Start Date End Date Taking? Authorizing Provider  diphenhydrAMINE (BENADRYL) 25 mg capsule Take 25 mg by mouth every 6 (six) hours as needed for allergies.     [provider]  doxycycline (VIBRAMYCIN) 100 MG capsule Take 1 capsule (100 mg total) by mouth 2  (two) times daily. 09/14/20   Gilda Crease, MD  oxyCODONE-acetaminophen (PERCOCET) 5-325 MG tablet Take 1-2 tablets by mouth every 4 (four) hours as needed. 09/14/20   Gilda Crease, MD  polyethylene glycol (MIRALAX) 17 g packet Take 17 g by mouth daily. 05/10/20   Long, Arlyss Repress, MD  promethazine (PHENERGAN) 25 MG tablet Take 25 mg by mouth daily as needed. 08/23/20   [provider]  valACYclovir (VALTREX) 1000 MG tablet Take 1 tablet (1,000 mg total) by mouth 3 (three) times daily. 09/14/20   Gilda Crease, MD  dicyclomine (BENTYL) 20 MG tablet Take 1 tablet (20 mg total) by mouth 3 (three) times daily as needed for spasms. Patient not taking: Reported on 09/02/2020 05/10/20 09/02/20  Maia Plan, MD  escitalopram (LEXAPRO) 10 MG tablet Take 1/2 tab po qd x 4-5 days, then increase to 1 tab po qd Patient not taking: Reported on 05/10/2020 01/18/19 09/02/20  Corie Chiquito, PMHNP  propranolol (INDERAL) 10 MG tablet Take 1-2 tabs po BID prn anxiety Patient not taking: Reported on 05/10/2020 01/18/19 09/02/20  Corie Chiquito, PMHNP    Allergies    Hydrocodone  Review of Systems   Review of Systems  Constitutional: Negative for fever.  Skin: Positive for rash.    Physical Exam Updated  Vital Signs BP (!) 132/94 (BP Location: Right Arm)   Pulse 88   Temp 97.8 F (36.6 C) (Oral)   Resp 16   LMP 08/15/2020 (Exact Date)   SpO2 100%   Physical Exam Vitals and nursing note reviewed.  Constitutional:      General: She is not in acute distress.    Appearance: Normal appearance. She is well-developed.  HENT:     Head: Normocephalic and atraumatic.     Right Ear: Hearing normal.     Left Ear: Hearing normal.     Nose: Nose normal.  Eyes:     Conjunctiva/sclera: Conjunctivae normal.     Pupils: Pupils are equal, round, and reactive to light.  Cardiovascular:     Rate and Rhythm: Regular rhythm.     Heart sounds: S1 normal and S2 normal. No murmur  heard.  No friction rub. No gallop.   Pulmonary:     Effort: Pulmonary effort is normal. No respiratory distress.     Breath sounds: Normal breath sounds.  Chest:     Chest wall: No tenderness.  Abdominal:     General: Bowel sounds are normal.     Palpations: Abdomen is soft.     Tenderness: There is no abdominal tenderness. There is no guarding or rebound. Negative signs include Murphy's sign and McBurney's sign.     Hernia: No hernia is present.  Musculoskeletal:        General: Normal range of motion.     Cervical back: Normal range of motion and neck supple.  Skin:    General: Skin is warm and dry.     Findings: Rash present.     Comments: Fairly well demarcated erythema and warmth from right flank radiating around to right lower quadrant.  Area very tender to light touch.  No vesicles noted.  Neurological:     Mental Status: She is alert and oriented to person, place, and time.     GCS: GCS eye subscore is 4. GCS verbal subscore is 5. GCS motor subscore is 6.     Cranial Nerves: No cranial nerve deficit.     Sensory: No sensory deficit.     Coordination: Coordination normal.  Psychiatric:        Speech: Speech normal.        Behavior: Behavior normal.        Thought Content: Thought content normal.     ED Results / Procedures / Treatments   Labs (all labs ordered are listed, but only abnormal results are displayed) Labs Reviewed  BASIC METABOLIC PANEL - Abnormal; Notable for the following components:      Result Value   Glucose, Bld 101 (*)    BUN 5 (*)    Calcium 8.6 (*)    All other components within normal limits  CBC    EKG None  Radiology No results found.  Procedures Procedures (including critical care time)  Medications Ordered in ED Medications - No data to display  ED Course  I have reviewed the triage vital signs and the nursing notes.  Pertinent labs & imaging results that were available during my care of the patient were reviewed by me and  considered in my medical decision making (see chart for details).    MDM Rules/Calculators/A&P                          Patient with a painful rash on the right flank area.  Area is warm and erythematous.  There is generalized tenderness to light touch.  This does seem to follow a dermatome but there are no vesicles noted.  This is likely early shingles, will cover with Valtrex.  As there are no vesicles, will also cover with doxycycline in the event that this is a cellulitis.  Patient provided analgesia.  Return if her symptoms worsen.  Final Clinical Impression(s) / ED Diagnoses Final diagnoses:  Herpes zoster without complication  Cellulitis of abdominal wall    Rx / DC Orders ED Discharge Orders         Ordered    valACYclovir (VALTREX) 1000 MG tablet  3 times daily        09/14/20 0405    doxycycline (VIBRAMYCIN) 100 MG capsule  2 times daily        09/14/20 0405    oxyCODONE-acetaminophen (PERCOCET) 5-325 MG tablet  Every 4 hours PRN        09/14/20 0405           Gilda Crease, MD 09/14/20 0405

## 2020-09-14 NOTE — ED Notes (Signed)
Rash assessed that is localized to the patient's lower right trunk that wraps around towards her back. No blisters assessed. Pt describes pain as a dull throbbing pain like a tooth ache. Site is red and hot to touch. Pt denies any Benadryl. Pt is applying heat and cool compacts to the area for relief which is minimal relief.

## 2020-12-02 ENCOUNTER — Emergency Department (HOSPITAL_COMMUNITY)
Admission: EM | Admit: 2020-12-02 | Discharge: 2020-12-03 | Disposition: A | Discharge status: Home / Self Care | Payer: BLUE CROSS/BLUE SHIELD | Attending: Emergency Medicine | Admitting: Emergency Medicine

## 2020-12-02 ENCOUNTER — Other Ambulatory Visit: Payer: Self-pay

## 2020-12-02 DIAGNOSIS — Z5321 Procedure and treatment not carried out due to patient leaving prior to being seen by health care provider: Secondary | ICD-10-CM | POA: Insufficient documentation

## 2020-12-02 DIAGNOSIS — R319 Hematuria, unspecified: Secondary | ICD-10-CM | POA: Diagnosis not present

## 2020-12-02 DIAGNOSIS — R1031 Right lower quadrant pain: Secondary | ICD-10-CM | POA: Diagnosis present

## 2020-12-02 LAB — URINALYSIS, ROUTINE W REFLEX MICROSCOPIC
Bilirubin Urine: NEGATIVE
Glucose, UA: NEGATIVE mg/dL
Hgb urine dipstick: NEGATIVE
Ketones, ur: NEGATIVE mg/dL
Leukocytes,Ua: NEGATIVE
Nitrite: NEGATIVE
Protein, ur: 30 mg/dL — AB
Specific Gravity, Urine: 1.013 (ref 1.005–1.030)
pH: 7 (ref 5.0–8.0)

## 2020-12-02 LAB — LIPASE, BLOOD: Lipase: 27 U/L (ref 11–51)

## 2020-12-02 LAB — COMPREHENSIVE METABOLIC PANEL
ALT: 12 U/L (ref 0–44)
AST: 16 U/L (ref 15–41)
Albumin: 3.6 g/dL (ref 3.5–5.0)
Alkaline Phosphatase: 41 U/L (ref 38–126)
Anion gap: 8 (ref 5–15)
BUN: 6 mg/dL (ref 6–20)
CO2: 23 mmol/L (ref 22–32)
Calcium: 8.6 mg/dL — ABNORMAL LOW (ref 8.9–10.3)
Chloride: 106 mmol/L (ref 98–111)
Creatinine, Ser: 0.91 mg/dL (ref 0.44–1.00)
GFR, Estimated: >60 mL/min (ref >60)
Glucose, Bld: 94 mg/dL (ref 70–99)
Potassium: 3.4 mmol/L — ABNORMAL LOW (ref 3.5–5.1)
Sodium: 137 mmol/L (ref 135–145)
Total Bilirubin: 0.6 mg/dL (ref 0.3–1.2)
Total Protein: 6.4 g/dL — ABNORMAL LOW (ref 6.5–8.1)

## 2020-12-02 LAB — I-STAT BETA HCG BLOOD, ED (MC, WL, AP ONLY): I-stat hCG, quantitative: <5 m[IU]/mL (ref <5)

## 2020-12-02 LAB — CBC
HCT: 42.7 % (ref 36.0–46.0)
Hemoglobin: 13.5 g/dL (ref 12.0–15.0)
MCH: 29.4 pg (ref 26.0–34.0)
MCHC: 31.6 g/dL (ref 30.0–36.0)
MCV: 93 fL (ref 80.0–100.0)
Platelets: 263 10*3/uL (ref 150–400)
RBC: 4.59 MIL/uL (ref 3.87–5.11)
RDW: 12.5 % (ref 11.5–15.5)
WBC: 4.8 10*3/uL (ref 4.0–10.5)
nRBC: 0 % (ref 0.0–0.2)

## 2020-12-02 NOTE — ED Triage Notes (Signed)
Pt reports right lower abdominal pain for the last day and half. Pt thinks she saw red blood in her stool. Pt reports vomiting the last day and a half as well. VSS. NAD at present.

## 2020-12-03 NOTE — ED Notes (Signed)
Called pt for vitals no answer 

## 2020-12-07 ENCOUNTER — Encounter (HOSPITAL_COMMUNITY): Payer: Self-pay | Admitting: *Deleted

## 2020-12-07 ENCOUNTER — Emergency Department (HOSPITAL_COMMUNITY)
Admission: EM | Admit: 2020-12-07 | Discharge: 2020-12-08 | Disposition: A | Discharge status: Home / Self Care | Payer: BLUE CROSS/BLUE SHIELD | Attending: Emergency Medicine | Admitting: Emergency Medicine

## 2020-12-07 ENCOUNTER — Other Ambulatory Visit: Payer: Self-pay

## 2020-12-07 DIAGNOSIS — R109 Unspecified abdominal pain: Secondary | ICD-10-CM

## 2020-12-07 DIAGNOSIS — A5901 Trichomonal vulvovaginitis: Secondary | ICD-10-CM | POA: Diagnosis not present

## 2020-12-07 DIAGNOSIS — R102 Pelvic and perineal pain: Secondary | ICD-10-CM

## 2020-12-07 DIAGNOSIS — R112 Nausea with vomiting, unspecified: Secondary | ICD-10-CM | POA: Diagnosis not present

## 2020-12-07 NOTE — ED Triage Notes (Signed)
Pt reporting right lower abdominal pain for about 2 months, worse about a week. Nausea.

## 2020-12-08 ENCOUNTER — Emergency Department (HOSPITAL_COMMUNITY): Payer: BLUE CROSS/BLUE SHIELD

## 2020-12-08 ENCOUNTER — Other Ambulatory Visit: Payer: Self-pay

## 2020-12-08 ENCOUNTER — Other Ambulatory Visit (HOSPITAL_COMMUNITY): Payer: BLUE CROSS/BLUE SHIELD

## 2020-12-08 LAB — COMPREHENSIVE METABOLIC PANEL
ALT: 12 U/L (ref 0–44)
AST: 17 U/L (ref 15–41)
Albumin: 3.7 g/dL (ref 3.5–5.0)
Alkaline Phosphatase: 44 U/L (ref 38–126)
Anion gap: 10 (ref 5–15)
BUN: 5 mg/dL — ABNORMAL LOW (ref 6–20)
CO2: 23 mmol/L (ref 22–32)
Calcium: 8.8 mg/dL — ABNORMAL LOW (ref 8.9–10.3)
Chloride: 105 mmol/L (ref 98–111)
Creatinine, Ser: 0.75 mg/dL (ref 0.44–1.00)
GFR, Estimated: >60 mL/min (ref >60)
Glucose, Bld: 91 mg/dL (ref 70–99)
Potassium: 3.5 mmol/L (ref 3.5–5.1)
Sodium: 138 mmol/L (ref 135–145)
Total Bilirubin: 0.5 mg/dL (ref 0.3–1.2)
Total Protein: 6.6 g/dL (ref 6.5–8.1)

## 2020-12-08 LAB — URINALYSIS, ROUTINE W REFLEX MICROSCOPIC
Bilirubin Urine: NEGATIVE
Glucose, UA: NEGATIVE mg/dL
Hgb urine dipstick: NEGATIVE
Ketones, ur: NEGATIVE mg/dL
Leukocytes,Ua: NEGATIVE
Nitrite: NEGATIVE
Protein, ur: NEGATIVE mg/dL
Specific Gravity, Urine: 1.009 (ref 1.005–1.030)
pH: 7 (ref 5.0–8.0)

## 2020-12-08 LAB — CBC
HCT: 39.4 % (ref 36.0–46.0)
Hemoglobin: 13 g/dL (ref 12.0–15.0)
MCH: 30.5 pg (ref 26.0–34.0)
MCHC: 33 g/dL (ref 30.0–36.0)
MCV: 92.5 fL (ref 80.0–100.0)
Platelets: 307 10*3/uL (ref 150–400)
RBC: 4.26 MIL/uL (ref 3.87–5.11)
RDW: 12.5 % (ref 11.5–15.5)
WBC: 5.7 10*3/uL (ref 4.0–10.5)
nRBC: 0 % (ref 0.0–0.2)

## 2020-12-08 LAB — WET PREP, GENITAL
Clue Cells Wet Prep HPF POC: NONE SEEN
Sperm: NONE SEEN
Yeast Wet Prep HPF POC: NONE SEEN

## 2020-12-08 LAB — I-STAT BETA HCG BLOOD, ED (MC, WL, AP ONLY): I-stat hCG, quantitative: 5 m[IU]/mL — ABNORMAL HIGH (ref <5)

## 2020-12-08 LAB — LIPASE, BLOOD: Lipase: 28 U/L (ref 11–51)

## 2020-12-08 LAB — PREGNANCY, URINE: Preg Test, Ur: NEGATIVE

## 2020-12-08 MED ORDER — ONDANSETRON HCL 4 MG/2ML IJ SOLN
4.0000 mg | Freq: Once | INTRAMUSCULAR | Status: AC
  Administered 2020-12-08: 4 mg via INTRAVENOUS
  Filled 2020-12-08: qty 2

## 2020-12-08 MED ORDER — KETOROLAC TROMETHAMINE 15 MG/ML IJ SOLN
15.0000 mg | Freq: Once | INTRAMUSCULAR | Status: AC
  Administered 2020-12-08: 15 mg via INTRAVENOUS
  Filled 2020-12-08: qty 1

## 2020-12-08 MED ORDER — ONDANSETRON HCL 4 MG PO TABS: 4.0000 mg | ORAL_TABLET | Freq: Four times a day (QID) | ORAL | 0 refills | Status: DC

## 2020-12-08 MED ORDER — METRONIDAZOLE 500 MG PO TABS
500.0000 mg | ORAL_TABLET | Freq: Once | ORAL | Status: AC
  Administered 2020-12-08: 500 mg via ORAL
  Filled 2020-12-08: qty 1

## 2020-12-08 MED ORDER — IOHEXOL 300 MG/ML  SOLN
100.0000 mL | Freq: Once | INTRAMUSCULAR | Status: AC | PRN
  Administered 2020-12-08: 100 mL via INTRAVENOUS

## 2020-12-08 MED ORDER — METRONIDAZOLE 500 MG PO TABS: 500.0000 mg | ORAL_TABLET | Freq: Two times a day (BID) | ORAL | 0 refills | Status: DC

## 2020-12-08 MED ORDER — FENTANYL CITRATE (PF) 100 MCG/2ML IJ SOLN
50.0000 ug | Freq: Once | INTRAMUSCULAR | Status: AC
  Administered 2020-12-08: 50 ug via INTRAVENOUS
  Filled 2020-12-08: qty 2

## 2020-12-08 MED ORDER — LACTATED RINGERS IV BOLUS
1000.0000 mL | Freq: Once | INTRAVENOUS | Status: AC
  Administered 2020-12-08: 1000 mL via INTRAVENOUS

## 2020-12-08 MED ORDER — NAPROXEN 500 MG PO TABS: 500.0000 mg | ORAL_TABLET | Freq: Two times a day (BID) | ORAL | 0 refills | Status: DC | PRN

## 2020-12-08 NOTE — Discharge Instructions (Signed)
The CAT scan today was normal.  You do have a vaginal infection but the rest of your labs were normal.  Take the first dose of antibiotic tonight.  Use the pain medication as needed.  Return if the vomiting returns or you start having fever or severe pain.

## 2020-12-08 NOTE — ED Provider Notes (Signed)
[facility] EMERGENCY DEPARTMENT Provider Note   CSN: [id number] Arrival date & time: 12/07/20  2211     History Chief Complaint  Patient presents with  . Abdominal Pain    Patricia [name] is a 40 y.o. female.  Patient is a 40 year old female with a history of seizure-like activity, migraine, adjustment disorder, prior tubal ligation who is presenting today with a complaint of abdominal pain.  Patient reports she has had this pain intermittently for greater than 6 months but it will usually go away.  This episode has started 2 to 3 days ago and significantly worsened.  She came to the emergency room 2 days ago when the symptoms started but due to the long wait she decided to go home and hope that it improved.  The pain has gradually worsened and last night she started having nausea and vomiting as well.  The pain is located in the right side of her abdomen and is a 10 out of 10.  She reports its a deep aching pain but occasionally will have sharp shooting pain towards the vagina.  She did notice some vaginal bleeding yesterday but has not noticed anything today.  She has not had any dysuria, frequency or urgency.  She denies any diarrhea or fever.  She has not had cough, congestion or shortness of breath.  Patient reports that she would normally have the pain and it would go away.  In June and July she was seen in the emergency room at that time had an MRI of her abdomen, right upper quadrant ultrasound, pelvic ultrasound and abdominal CT.  It did show that she had an anatomic variant of a dilated common bile duct which was her baseline and not the cause of her symptoms.  Ultrasound did show a right ovarian cyst without significant changes.  At that time patient was also treated for trichomonas.  She currently denies any vaginal discharge but does complain of some pelvic pain as well.  In the past she has been noted to have shingles of her abdomen but has not noticed any  rashes or localized swelling in the last few days.  The history is provided by the patient and medical records.  Abdominal Pain      Past Medical History:  Diagnosis Date  . Migraine     Patient Active Problem List   Diagnosis Date Noted  . Malnutrition of moderate degree 11/01/2015  . Seizure-like activity (HCC) 11/01/2015  . Hypoglycemia 10/31/2015  . Dysphagia 10/31/2015  . Adjustment disorder with mixed anxiety and depressed mood 10/31/2015    Past Surgical History:  Procedure Laterality Date  . TUBAL LIGATION       OB History   No obstetric history on file.     Family History  Problem Relation Age of Onset  . Hypertension Other   . Anxiety disorder Mother   . Bipolar disorder Mother   . Anxiety disorder Maternal Grandmother   . Depression Maternal Grandmother   . Autism Son   . Seizures Son   . Diabetes Maternal Great-grandmother     Social History   Tobacco Use  . Smoking status: Never Smoker  . Smokeless tobacco: Never Used  Vaping Use  . Vaping Use: Never used  Substance Use Topics  . Alcohol use: Yes    Comment: occ  . Drug use: No    Home Medications Prior to Admission medications   Medication Sig Start Date End Date Taking? Authorizing Provider  diphenhydrAMINE (BENADRYL) 25 mg capsule Take 25 mg by mouth every 6 (six) hours as needed for allergies.     [provider]  doxycycline (VIBRAMYCIN) 100 MG capsule Take 1 capsule (100 mg total) by mouth 2 (two) times daily. 09/14/20   Gilda Crease, MD  oxyCODONE-acetaminophen (PERCOCET) 5-325 MG tablet Take 1-2 tablets by mouth every 4 (four) hours as needed. 09/14/20   Gilda Crease, MD  polyethylene glycol (MIRALAX) 17 g packet Take 17 g by mouth daily. 05/10/20   Long, Arlyss Repress, MD  promethazine (PHENERGAN) 25 MG tablet Take 25 mg by mouth daily as needed. 08/23/20   [provider]  valACYclovir (VALTREX) 1000 MG tablet Take 1 tablet (1,000 mg total) by mouth  3 (three) times daily. 09/14/20   Gilda Crease, MD  dicyclomine (BENTYL) 20 MG tablet Take 1 tablet (20 mg total) by mouth 3 (three) times daily as needed for spasms. Patient not taking: Reported on 09/02/2020 05/10/20 09/02/20  Maia Plan, MD  escitalopram (LEXAPRO) 10 MG tablet Take 1/2 tab po qd x 4-5 days, then increase to 1 tab po qd Patient not taking: Reported on 05/10/2020 01/18/19 09/02/20  Corie Chiquito, PMHNP  propranolol (INDERAL) 10 MG tablet Take 1-2 tabs po BID prn anxiety Patient not taking: Reported on 05/10/2020 01/18/19 09/02/20  Corie Chiquito, PMHNP    Allergies    Hydrocodone  Review of Systems   Review of Systems  Gastrointestinal: Positive for abdominal pain.  All other systems reviewed and are negative.   Physical Exam Updated Vital Signs BP 124/81 (BP Location: Right Arm)   Pulse 79   Temp 98.4 F (36.9 C) (Oral)   Resp 15   LMP 11/07/2020   SpO2 100%   Physical Exam Vitals and nursing note reviewed.  Constitutional:      General: She is not in acute distress.    Appearance: She is well-developed, normal weight and well-nourished.  HENT:     Head: Normocephalic and atraumatic.  Eyes:     Extraocular Movements: EOM normal.     Pupils: Pupils are equal, round, and reactive to light.  Cardiovascular:     Rate and Rhythm: Normal rate and regular rhythm.     Pulses: Intact distal pulses.     Heart sounds: Normal heart sounds. No murmur heard. No friction rub.  Pulmonary:     Effort: Pulmonary effort is normal.     Breath sounds: Normal breath sounds. No wheezing or rales.  Abdominal:     General: Abdomen is flat. Bowel sounds are decreased. There is no distension.     Palpations: Abdomen is soft.     Tenderness: There is abdominal tenderness in the right upper quadrant, right lower quadrant, suprapubic area and left lower quadrant. There is guarding. There is no right CVA tenderness, left CVA tenderness or rebound. Negative signs  include Murphy's sign.  Genitourinary:    Vagina: Normal.     Cervix: Discharge present. No cervical motion tenderness.     Uterus: Normal.      Adnexa: Left adnexa normal.       Right: Tenderness and fullness present. No mass.         Left: No mass, tenderness or fullness.       Comments: Thin yellow discharge present in vaginal vault.  No bleeding  Musculoskeletal:        General: No tenderness. Normal range of motion.     Comments: No edema  Skin:  General: Skin is warm and dry.     Findings: No rash.  Neurological:     Mental Status: She is alert and oriented to person, place, and time.     Cranial Nerves: No cranial nerve deficit.  Psychiatric:        Mood and Affect: Mood and affect normal.        Behavior: Behavior normal.     ED Results / Procedures / Treatments   Labs (all labs ordered are listed, but only abnormal results are displayed) Labs Reviewed  WET PREP, GENITAL - Abnormal; Notable for the following components:      Result Value   Trich, Wet Prep PRESENT (*)    WBC, Wet Prep HPF POC FEW (*)    All other components within normal limits  COMPREHENSIVE METABOLIC PANEL - Abnormal; Notable for the following components:   BUN 5 (*)    Calcium 8.8 (*)    All other components within normal limits  URINALYSIS, ROUTINE W REFLEX MICROSCOPIC - Abnormal; Notable for the following components:   APPearance HAZY (*)    All other components within normal limits  I-STAT BETA HCG BLOOD, ED ([facility], [facility], [facility] ONLY) - Abnormal; Notable for the following components:   I-stat hCG, quantitative 5.0 (*)    All other components within normal limits  LIPASE, BLOOD  CBC  PREGNANCY, URINE  GC/CHLAMYDIA PROBE AMP ([place]) NOT AT [facility]    EKG None  Radiology CT ABDOMEN PELVIS W CONTRAST  Result Date: 12/08/2020 CLINICAL DATA:  Pelvic pain. EXAM: CT ABDOMEN AND PELVIS WITH CONTRAST TECHNIQUE: Multidetector CT imaging of the abdomen and pelvis was performed using the standard  protocol following bolus administration of intravenous contrast. CONTRAST:  100mL OMNIPAQUE IOHEXOL 300 MG/ML  SOLN COMPARISON:  June 20, 2020. FINDINGS: Lower chest: No acute abnormality. Hepatobiliary: No focal liver abnormality is seen. No gallstones, gallbladder wall thickening, or biliary dilatation. Pancreas: Unremarkable. No pancreatic ductal dilatation or surrounding inflammatory changes. Spleen: Normal in size without focal abnormality. Adrenals/Urinary Tract: Adrenal glands are unremarkable. Kidneys are normal, without renal calculi, focal lesion, or hydronephrosis. Bladder is unremarkable. Stomach/Bowel: Stomach is within normal limits. Appendix appears normal. No evidence of bowel wall thickening, distention, or inflammatory changes. Vascular/Lymphatic: No significant vascular findings are present. No enlarged abdominal or pelvic lymph nodes. Reproductive: Uterus and bilateral adnexa are unremarkable. Other: No abdominal wall hernia or abnormality. No abdominopelvic ascites. Musculoskeletal: No acute or significant osseous findings. IMPRESSION: No abnormality seen in the abdomen or pelvis. Electronically Signed   By: Lupita RaiderJames  Green Jr M.D.   On: 12/08/2020 14:17    Procedures Procedures (including critical care time)  Medications Ordered in ED Medications  fentaNYL (SUBLIMAZE) injection 50 mcg (has no administration in time range)  ondansetron (ZOFRAN) injection 4 mg (has no administration in time range)  lactated ringers bolus 1,000 mL (has no administration in time range)    ED Course  I have reviewed the triage vital signs and the nursing notes.  Pertinent labs & imaging results that were available during my care of the patient were reviewed by me and considered in my medical decision making (see chart for details).    MDM Rules/Calculators/A&P                          Patient presenting today with nonspecific abdominal pain that has been present now for the last 3 to 4 days and  worsening.  She has  had this pain in the past but had resolved and returned recently.  She is also having nausea and vomiting.  Patient has had evaluation with abdominal MRI, right upper quadrant ultrasound, abdominal CT and pelvic ultrasound.  Patient had no findings concerning for gallbladder pathology at that time, CT showed constipation but no other acute intestinal issue and ultrasound did show a right ovarian cyst.  On exam patient does have right lower abdominal and pelvic pain but also suprapubic and left lower quadrant pain.  She does have some mild right upper quadrant pain but no Murphy sign or significant tenderness.  She denies any urinary symptoms but did note some vaginal bleeding yesterday.  Concern for a possible pelvic cause of her symptoms.  Although she has had zoster in the past there is no evidence of shingles today however it is possible that she is having postherpetic pain.  UA within normal limits, CMP, lipase and CBC all without acute findings.  hCG was reported at 5.0 but urine pregnancy test was negative.  She has had a tubal ligation and low suspicion for pregnancy at this time.  We will do a pelvic exam to further evaluate for a pelvic source for her pain.  Patient given pain and nausea control.  She was also given IV fluids.   11:15 AM On pelvic exam patient does have tenderness and some mild fullness in the right adnexa but no cervical motion tenderness or left adnexal tenderness.  Mild thin yellow discharge present.  Pelvic ultrasound pending for further evaluation.  Patient did have some improvement in her pain with the fentanyl but was given a second dose.  As well as some Toradol.  1:20 PM Pt refusing U/S and states she can't take the pain and would prefer CT despite increased radiation exposure.  U/s discontinued and CT ordered.  Wet prep positive for trichomonas again and will need treatment with flagyl.  2:33 PM Pt's pain has now completely resolved and she feels much  better.  CT without acute findings.  Will treat for trich and have pt f/u with PCP as she will need outpt f/u with OB/GYN and GI.  MDM Number of Diagnoses or Management Options   Amount and/or Complexity of Data Reviewed Clinical lab tests: ordered and reviewed Tests in the radiology section of CPT: ordered and reviewed Decide to obtain previous medical records or to obtain history from someone other than the patient: yes Obtain history from someone other than the patient: yes Review and summarize past medical records: yes Discuss the patient with other providers: no Independent visualization of images, tracings, or specimens: yes  Risk of Complications, Morbidity, and/or Mortality Presenting problems: moderate Diagnostic procedures: low Management options: low  Patient Progress Patient progress: improved    Final Clinical Impression(s) / ED Diagnoses Final diagnoses:  Right-sided abdominal pain of unknown cause  Trichomonas vaginalis (TV) infection    Rx / DC Orders ED Discharge Orders         Ordered    metroNIDAZOLE (FLAGYL) 500 MG tablet  2 times daily        12/08/20 1438    ondansetron (ZOFRAN) 4 MG tablet  Every 6 hours        12/08/20 1438    naproxen (NAPROSYN) 500 MG tablet  2 times daily PRN        12/08/20 1438           Gwyneth Sprout, MD 12/08/20 1441

## 2020-12-10 LAB — GC/CHLAMYDIA PROBE AMP (~~LOC~~) NOT AT ARMC
Chlamydia: NEGATIVE
Comment: NEGATIVE
Comment: NORMAL
Neisseria Gonorrhea: NEGATIVE

## 2021-07-30 IMAGING — CT CT ABD-PELV W/ CM
2 of 5 series · 16 of 46 positions shown, 18 images · IV contrast (Omnipaque)
Comparison: 10/04/2017

CLINICAL DATA: Nausea and vomiting since yesterday, right-sided
flank pain, recent urinary tract infection

EXAM:
CT ABDOMEN AND PELVIS WITH CONTRAST
TECHNIQUE: Multidetector CT imaging of the abdomen and pelvis was performed
using the standard protocol following bolus administration of
intravenous contrast.
CONTRAST:  100mL OMNIPAQUE IOHEXOL 300 MG/ML  SOLN

[Series 2: axial st · axial · 0.76mm/px · z∈[-432,-36]mm · 13 of 89 slices shown, 15 images]
[im 5/89  soft-tissue]
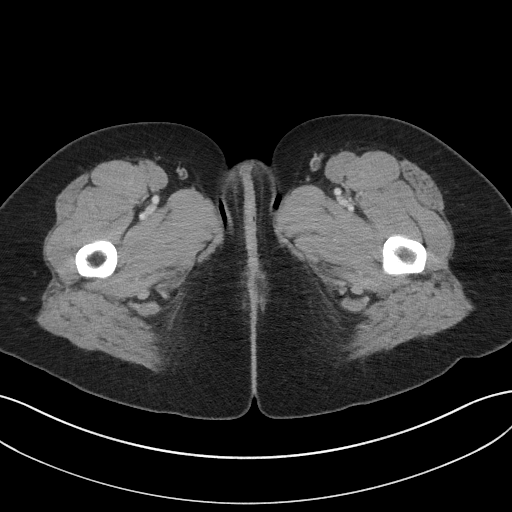
[im 5/89  bone]
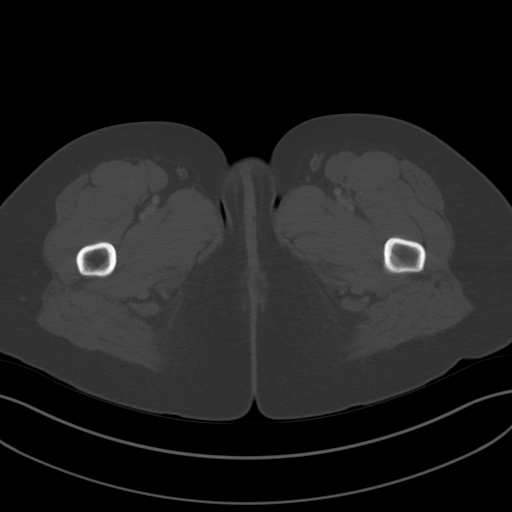
[im 14/89  soft-tissue]
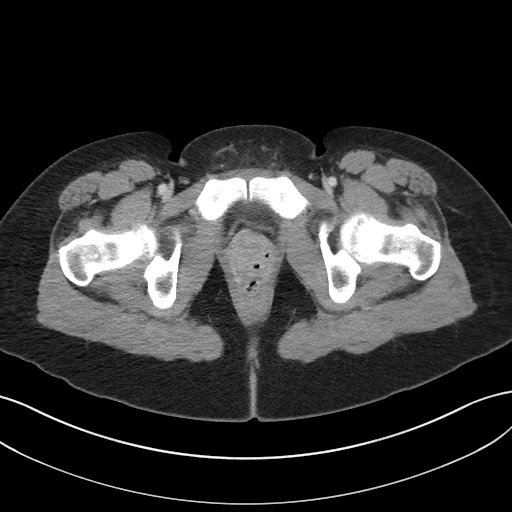
[im 19/89  soft-tissue]
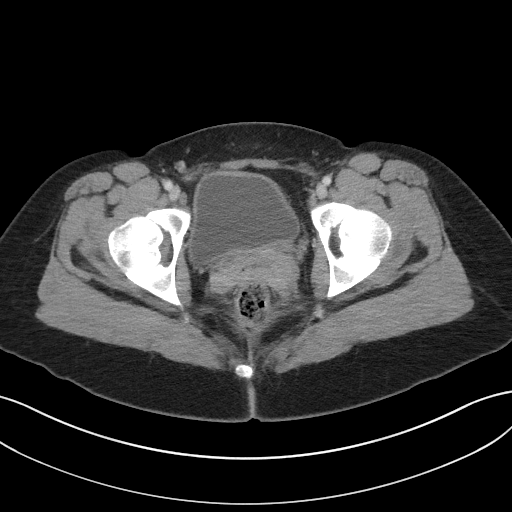
[im 24/89  soft-tissue]
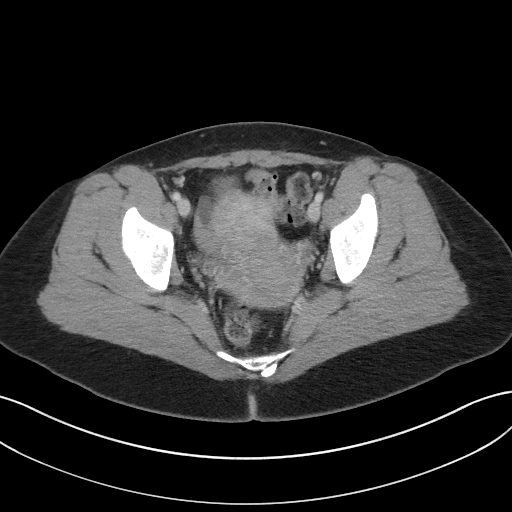
[im 33/89  soft-tissue]
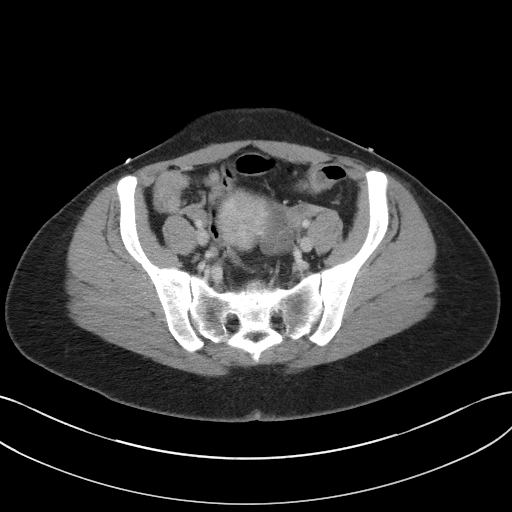
[im 38/89  soft-tissue]
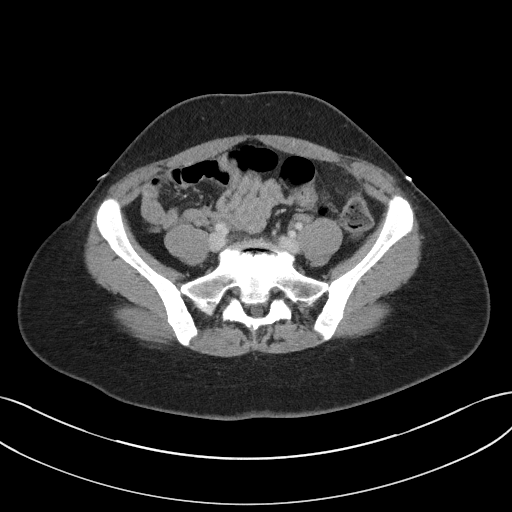
[im 47/89  soft-tissue]
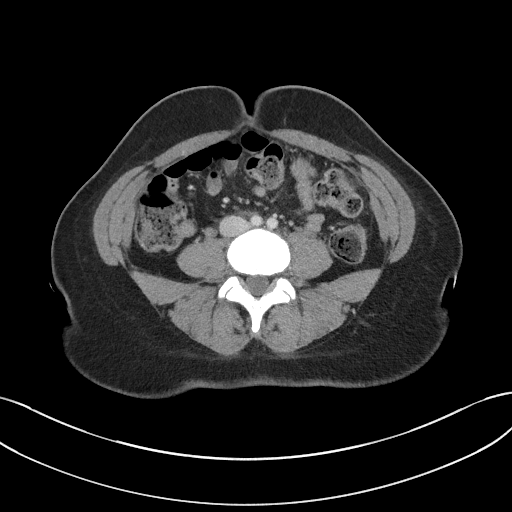
[im 51/89  soft-tissue]
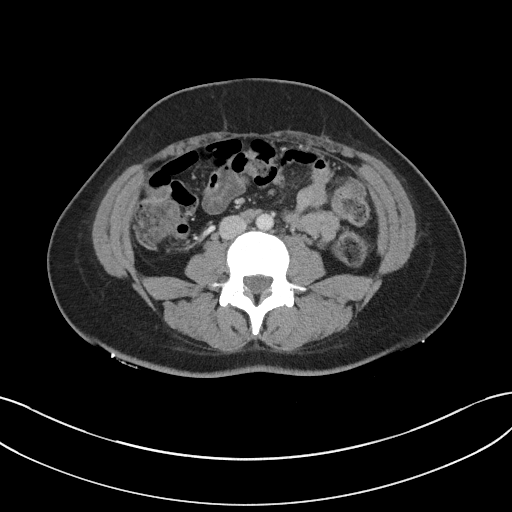
[im 56/89  soft-tissue]
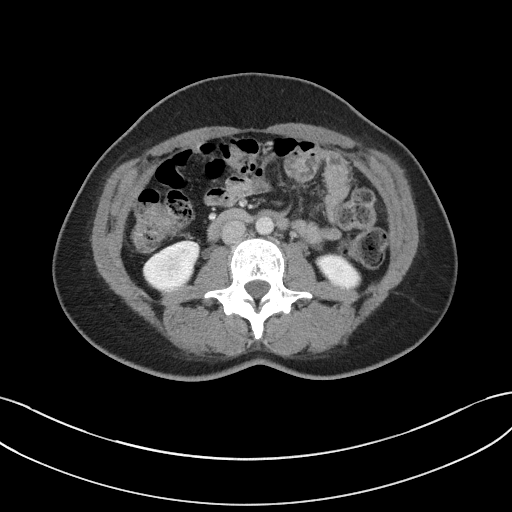
[im 56/89  bone]
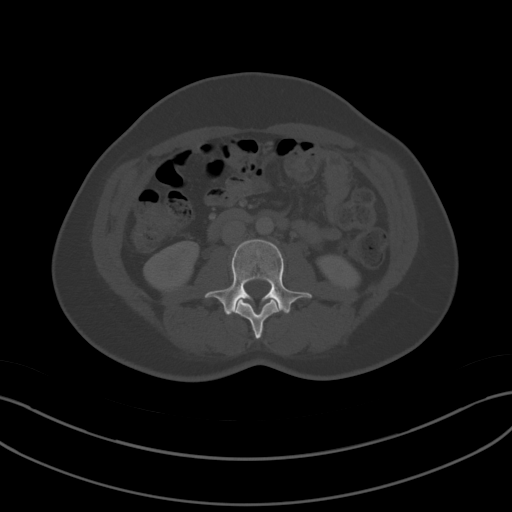
[im 65/89  soft-tissue]
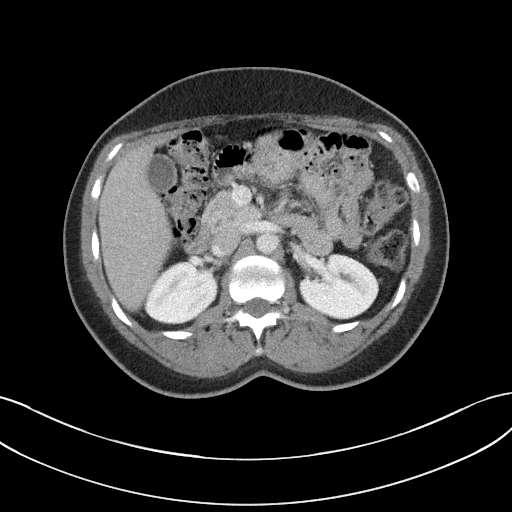
[im 70/89  soft-tissue]
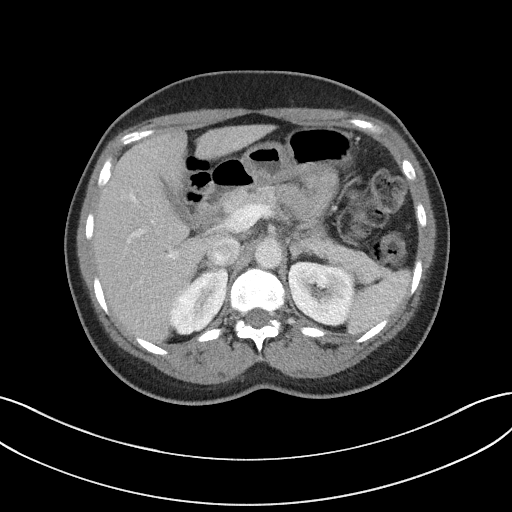
[im 75/89  soft-tissue]
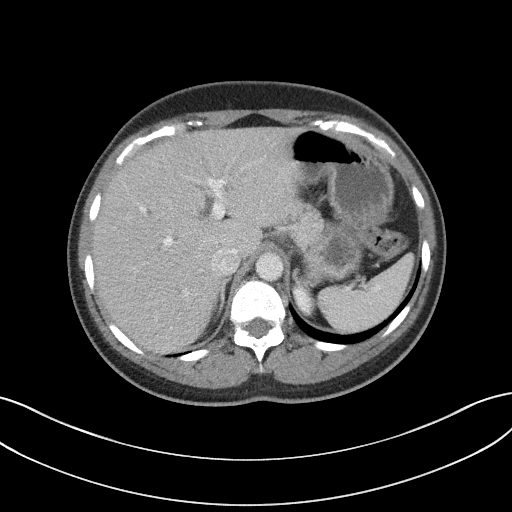
[im 84/89  soft-tissue]
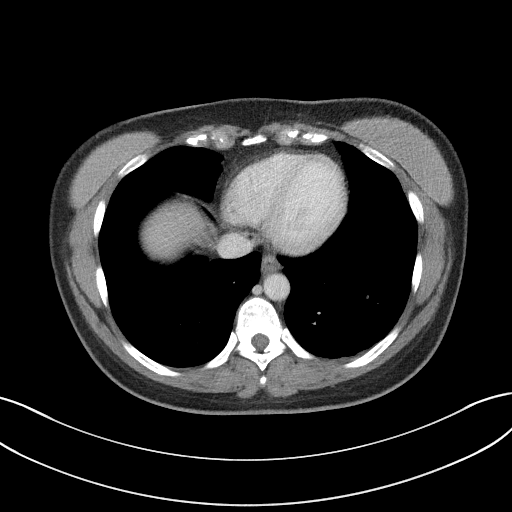

[Series 5: coronal st · coronal · 0.72mm/px · 3 of 89 slices shown]
[im 30/89  soft-tissue]
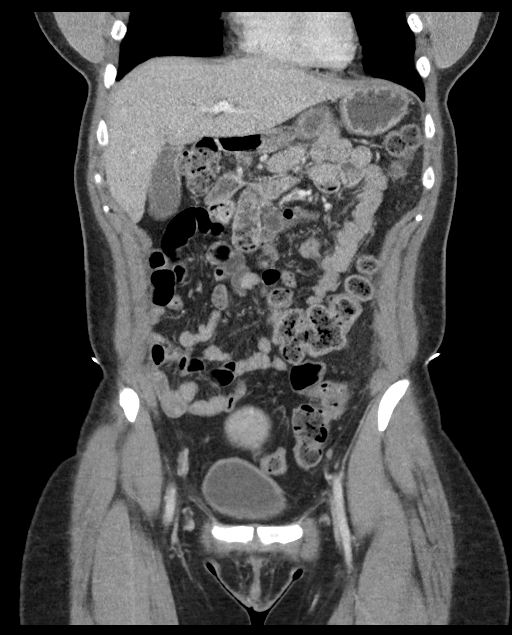
[im 40/89  soft-tissue]
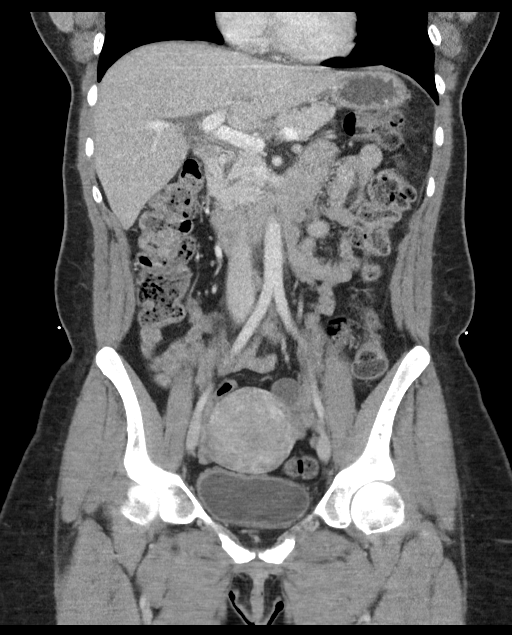
[im 49/89  soft-tissue]
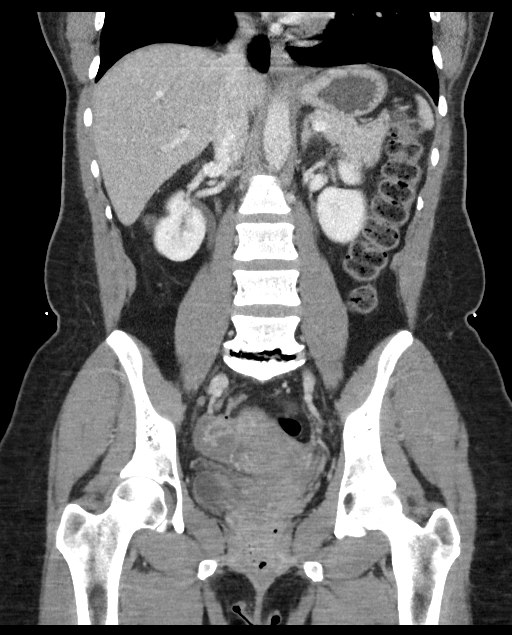

[16 of 46 positions shown; findings below may reference images not displayed]

FINDINGS: Lower chest: No acute pleural or parenchymal lung disease.

Hepatobiliary: No focal liver abnormality is seen. No gallstones,
gallbladder wall thickening, or biliary dilatation.

Pancreas: Unremarkable. No pancreatic ductal dilatation or
surrounding inflammatory changes.

Spleen: Normal in size without focal abnormality.

Adrenals/Urinary Tract: Adrenal glands are unremarkable. The kidneys
enhance normally and symmetrically. Minimal cortical scarring lower
pole right kidney unchanged. No urinary tract calculi or obstructive
uropathy. The bladder is unremarkable.

Stomach/Bowel: No bowel obstruction or ileus. Normal gas-filled
appendix right lower quadrant. Moderate stool throughout the colon.

Vascular/Lymphatic: No significant vascular findings are present. No
enlarged abdominal or pelvic lymph nodes.

Reproductive: Uterus and bilateral adnexa are unremarkable.

Other: No abdominal wall hernia or abnormality. No abdominopelvic
ascites.

Musculoskeletal: Prominent spondylosis at L5/S1 unchanged.
Reconstructed images demonstrate no additional findings.
IMPRESSION: 1. No acute intra-abdominal or intrapelvic process. Normal appendix.

## 2023-01-06 ENCOUNTER — Other Ambulatory Visit: Payer: Self-pay

## 2023-01-06 ENCOUNTER — Encounter (HOSPITAL_COMMUNITY): Payer: Self-pay | Admitting: Emergency Medicine

## 2023-01-06 ENCOUNTER — Emergency Department (HOSPITAL_COMMUNITY)
Admission: EM | Admit: 2023-01-06 | Discharge: 2023-01-06 | Disposition: A | Discharge status: Home / Self Care | Payer: BLUE CROSS/BLUE SHIELD | Attending: Emergency Medicine | Admitting: Emergency Medicine

## 2023-01-06 DIAGNOSIS — R21 Rash and other nonspecific skin eruption: Secondary | ICD-10-CM | POA: Diagnosis present

## 2023-01-06 DIAGNOSIS — B029 Zoster without complications: Secondary | ICD-10-CM | POA: Insufficient documentation

## 2023-01-06 HISTORY — DX: Zoster without complications: B02.9

## 2023-01-06 MED ORDER — GABAPENTIN 100 MG PO CAPS: 300.0000 mg | ORAL_CAPSULE | Freq: Three times a day (TID) | ORAL | 0 refills | Status: DC

## 2023-01-06 MED ORDER — VALACYCLOVIR HCL 1 G PO TABS: 1000.0000 mg | ORAL_TABLET | Freq: Three times a day (TID) | ORAL | 0 refills | Status: DC

## 2023-01-06 NOTE — ED Triage Notes (Signed)
Pt c/o redness that is warm to the touch her left flank that started today. Pt has hx of shingles.

## 2023-01-06 NOTE — Discharge Instructions (Signed)
You were evaluated in the emergency department for a rash.  This looks to be a shingles outbreak.  An antiviral has been sent to the 24-hour pharmacy located in your discharge paperwork.  Please pick this up tonight and start this soon as possible.  Take as directed.  An nerve pain medication has also been sent to the pharmacy with significant side effect of drowsiness, do not drive or operate machinery after taking this medication.  For the first 2 days take 1 tablet every 12 hours, you may increase to once every 8 hours (3 times per day) after the first 2 days if this is something you feel he can tolerate.  If not you may continue using it once every 12 hours.  Follow-up with your PCP by this Friday as discussed for recheck.  Return to the ED for new or worsening symptoms as discussed.

## 2023-01-06 NOTE — ED Provider Notes (Signed)
[facility] Provider Note   CSN: [id number] Arrival date & time: 01/06/23  2059     History  Chief Complaint  Patient presents with   Rash    Patricia [name] is a 42 y.o. female with Hx of shingles outbreaks presenting to the ED with new onset rash from this morning.  Woke up and noticed a tingling/nerve pain sensation described as "like a toothache".  Then began noticing a red stripe of a rash on the left side of her abdomen.  States this feels similar to her other shingles outbreaks however a bit stronger with pain.  Denies oral mucosal/mouth involvement, hands or pulm involvement, pruritus, or other rash.  This with contact or laying on that side.  Last shingles outbreak was 1 year ago.  No other complaints at this time.  The history is provided by the patient and medical records.  Rash     Home Medications Prior to Admission medications   Medication Sig Start Date End Date Taking? Authorizing Provider  gabapentin (NEURONTIN) 100 MG capsule Take 3 capsules (300 mg total) by mouth 3 (three) times daily. For the first 2 days, take 1 capsule every 12 hours, then increase to once every 8 hours.  Remember do not drive or operate machinery after taking this medication. 01/06/23  Yes Prince Rome, PA-C  valACYclovir (VALTREX) 1000 MG tablet Take 1 tablet (1,000 mg total) by mouth 3 (three) times daily. 01/06/23  Yes Prince Rome, PA-C  diphenhydrAMINE (BENADRYL) 25 mg capsule Take 25 mg by mouth every 6 (six) hours as needed for allergies.     [provider]  doxycycline (VIBRAMYCIN) 100 MG capsule Take 1 capsule (100 mg total) by mouth 2 (two) times daily. 09/14/20   Orpah Greek, MD  metroNIDAZOLE (FLAGYL) 500 MG tablet Take 1 tablet (500 mg total) by mouth 2 (two) times daily. 12/08/20   Blanchie Dessert, MD  naproxen (NAPROSYN) 500 MG tablet Take 1 tablet (500 mg total) by mouth 2 (two) times daily as  needed for moderate pain. 12/08/20   Blanchie Dessert, MD  ondansetron (ZOFRAN) 4 MG tablet Take 1 tablet (4 mg total) by mouth every 6 (six) hours. 12/08/20   Blanchie Dessert, MD  oxyCODONE-acetaminophen (PERCOCET) 5-325 MG tablet Take 1-2 tablets by mouth every 4 (four) hours as needed. 09/14/20   Orpah Greek, MD  polyethylene glycol (MIRALAX) 17 g packet Take 17 g by mouth daily. 05/10/20   Long, Wonda Olds, MD  promethazine (PHENERGAN) 25 MG tablet Take 25 mg by mouth daily as needed. 08/23/20   [provider]  dicyclomine (BENTYL) 20 MG tablet Take 1 tablet (20 mg total) by mouth 3 (three) times daily as needed for spasms. Patient not taking: Reported on 09/02/2020 05/10/20 09/02/20  Margette Fast, MD  escitalopram (LEXAPRO) 10 MG tablet Take 1/2 tab po qd x 4-5 days, then increase to 1 tab po qd Patient not taking: Reported on 05/10/2020 01/18/19 09/02/20  Thayer Headings, PMHNP  propranolol (INDERAL) 10 MG tablet Take 1-2 tabs po BID prn anxiety Patient not taking: Reported on 05/10/2020 01/18/19 09/02/20  Thayer Headings, PMHNP      Allergies    Hydrocodone    Review of Systems   Review of Systems  Skin:  Positive for rash.    Physical Exam Updated Vital Signs BP (!) 147/102   Pulse 84   Temp 98.3 F (36.8 C)   Resp  18   Ht 5' 1"$  (1.549 m)   Wt 61.2 kg   LMP 01/01/2023   SpO2 100%   BMI 25.51 kg/m  Physical Exam Vitals and nursing note reviewed.  Constitutional:      General: She is not in acute distress.    Appearance: She is well-developed.  HENT:     Head: Normocephalic and atraumatic.  Eyes:     Conjunctiva/sclera: Conjunctivae normal.  Cardiovascular:     Rate and Rhythm: Normal rate and regular rhythm.     Heart sounds: No murmur heard. Pulmonary:     Effort: Pulmonary effort is normal. No respiratory distress.     Breath sounds: Normal breath sounds.  Abdominal:     Palpations: Abdomen is soft.     Tenderness: There is no abdominal  tenderness.  Musculoskeletal:        General: No swelling.     Cervical back: Neck supple.  Skin:    General: Skin is warm and dry.     Capillary Refill: Capillary refill takes less than 2 seconds.     Findings: Rash present.     Comments: 5-6 cm long rash in a dermatomal distribution pattern of the left mid abdomen, very tender/sensitive to touch/palpation.  Without vesicular formation.  No pustules.  No palmar/sole involvement.  No oral mucosal involvement.  No angioedema.  Neurological:     Mental Status: She is alert.  Psychiatric:        Mood and Affect: Mood normal.     ED Results / Procedures / Treatments   Labs (all labs ordered are listed, but only abnormal results are displayed) Labs Reviewed - No data to display  EKG None  Radiology No results found.  Procedures Procedures    Medications Ordered in ED Medications - No data to display  ED Course/ Medical Decision Making/ A&P                             Medical Decision Making Risk Prescription drug management.   Patient is a 42 year old female presenting to the ED with new rash that appeared this morning.  Hx of shingles prior outbreaks.  Reportedly looks and feels the same as prior shingles outbreaks.  Last outbreak 1 year ago.  Rash consistent with shingles.  Likely early manifestation, no vesicular involvement yet.  Pt denies any difficulty breathing or swallowing.  No anaphylaxis or angioedema appreciated.  Airway without stridor and handling secretions without difficulty.  No blisters, no pustules, no warmth, no draining sinus tracts, no superficial abscesses, no bullous impetigo, no vesicles, no desquamation, no pruritus, no target lesions with dusky purpura or a central bulla.  Tender to touch.  No concern for superimposed infection.  No concern for SJS, TEN, TSS, tick borne illness, syphilis or other life-threatening condition.  Valacyclovir and gabapentin sent to pharmacy.    Recommend close follow up  with PCP.  Pt reports satisfaction with today's encounter.  Pt in NAD and good condition at time of discharge.  After consideration the patient's encounter today, I do not feel today's workup suggests an emergent condition requiring admission or immediate intervention beyond what has been performed at this time.  Safe for discharge; instructed to return immediately for worsening symptoms, change in symptoms or any other concerns.  I have reviewed the patients home medicines and have made adjustments as needed.  Discussed course of treatment with the patient, whom demonstrated understanding.  Patient  in agreement and has no further questions.    This chart was dictated using voice recognition software.  Despite best efforts to proofread,  errors can occur which can change the documentation meaning.         Final Clinical Impression(s) / ED Diagnoses Final diagnoses:  Herpes zoster without complication  Rash    Rx / DC Orders ED Discharge Orders          Ordered    valACYclovir (VALTREX) 1000 MG tablet  3 times daily        01/06/23 2129    gabapentin (NEURONTIN) 100 MG capsule  3 times daily        01/06/23 2129              Candace Cruise [phone]    Isla Pence, MD 01/13/23 [phone]

## 2023-02-26 ENCOUNTER — Telehealth: Payer: BLUE CROSS/BLUE SHIELD | Admitting: Family Medicine

## 2023-02-26 DIAGNOSIS — J111 Influenza due to unidentified influenza virus with other respiratory manifestations: Secondary | ICD-10-CM | POA: Diagnosis not present

## 2023-02-27 MED ORDER — OSELTAMIVIR PHOSPHATE 75 MG PO CAPS
75.0000 mg | ORAL_CAPSULE | Freq: Two times a day (BID) | ORAL | 0 refills | Status: AC
Start: 2023-02-27 — End: 2023-03-04

## 2023-02-27 NOTE — Progress Notes (Signed)
E visit for Flu like symptoms   We are sorry that you are not feeling well.  Here is how we plan to help! Based on what you have shared with me it looks like you may have a respiratory virus that may be influenza.  Influenza or "the flu" is   an infection caused by a respiratory virus. The flu virus is highly contagious and persons who did not receive their yearly flu vaccination may "catch" the flu from close contact.  We have anti-viral medications to treat the viruses that cause this infection. They are not a "cure" and only shorten the course of the infection. These prescriptions are most effective when they are given within the first 2 days of "flu" symptoms. Antiviral medication are indicated if you have a high risk of complications from the flu. You should  also consider an antiviral medication if you are in close contact with someone who is at risk. These medications can help patients avoid complications from the flu  but have side effects that you should know. Possible side effects from Tamiflu or oseltamivir include nausea, vomiting, diarrhea, dizziness, headaches, eye redness, sleep problems or other respiratory symptoms. You should not take Tamiflu if you have an allergy to oseltamivir or any to the ingredients in Tamiflu.  Based upon your symptoms and potential risk factors I have prescribed Oseltamivir (Tamiflu).  It has been sent to your designated pharmacy.  You will take one 75 mg capsule orally twice a day for the next 5 days.  ANYONE WHO HAS FLU SYMPTOMS SHOULD: Stay home. The flu is highly contagious and going out or to work exposes others! Be sure to drink plenty of fluids. Water is fine as well as fruit juices, sodas and electrolyte beverages. You may want to stay away from caffeine or alcohol. If you are nauseated, try taking small sips of liquids. How do you know if you are getting enough fluid? Your urine should be a pale yellow or almost colorless. Get rest. Taking a steamy  shower or using a humidifier may help nasal congestion and ease sore throat pain. Using a saline nasal spray works much the same way. Cough drops, hard candies and sore throat lozenges may ease your cough. Line up a caregiver. Have someone check on you regularly.   GET HELP RIGHT AWAY IF: You cannot keep down liquids or your medications. You become short of breath Your fell like you are going to pass out or loose consciousness. Your symptoms persist after you have completed your treatment plan MAKE SURE YOU  Understand these instructions. Will watch your condition. Will get help right away if you are not doing well or get worse.  Your e-visit answers were reviewed by a board certified advanced clinical practitioner to complete your personal care plan.  Depending on the condition, your plan could have included both over the counter or prescription medications.  If there is a problem please reply  once you have received a response from your provider.  Your safety is important to us.  If you have drug allergies check your prescription carefully.    You can use MyChart to ask questions about today's visit, request a non-urgent call back, or ask for a work or school excuse for 24 hours related to this e-Visit. If it has been greater than 24 hours you will need to follow up with your provider, or enter a new e-Visit to address those concerns.  You will get an e-mail in the next   two days asking about your experience.  I hope that your e-visit has been valuable and will speed your recovery. Thank you for using e-visits.    have provided 5 minutes of non face to face time during this encounter for chart review and documentation.   

## 2023-03-03 ENCOUNTER — Telehealth: Payer: BLUE CROSS/BLUE SHIELD | Admitting: Nurse Practitioner

## 2023-03-03 DIAGNOSIS — J069 Acute upper respiratory infection, unspecified: Secondary | ICD-10-CM

## 2023-03-03 MED ORDER — BENZONATATE 100 MG PO CAPS
100.0000 mg | ORAL_CAPSULE | Freq: Three times a day (TID) | ORAL | 0 refills | Status: DC | PRN
Start: 2023-03-03 — End: 2023-12-17

## 2023-03-03 MED ORDER — IPRATROPIUM BROMIDE 0.03 % NA SOLN
2.0000 | Freq: Two times a day (BID) | NASAL | 12 refills | Status: DC
Start: 2023-03-03 — End: 2023-12-17

## 2023-03-03 NOTE — Progress Notes (Signed)
E-Visit for Cough  We are sorry that you are not feeling well.  Here is how we plan to help!  Based on your presentation I believe you most likely have A cough due to a virus.  This is called viral bronchitis and is best treated by rest, plenty of fluids and control of the cough.  You may use Ibuprofen or Tylenol as directed to help your symptoms.     In addition you may use A prescription cough medication called Tessalon Perles 100mg . You may take 1-2 capsules every 8 hours as needed for your cough.  We will also call in a nasal spry to help with your runny nose  Meds ordered this encounter  Medications   benzonatate (TESSALON) 100 MG capsule    Sig: Take 1 capsule (100 mg total) by mouth 3 (three) times daily as needed.    Dispense:  30 capsule    Refill:  0   ipratropium (ATROVENT) 0.03 % nasal spray    Sig: Place 2 sprays into both nostrils every 12 (twelve) hours.    Dispense:  30 mL    Refill:  12     From your responses in the eVisit questionnaire you describe inflammation in the upper respiratory tract which is causing a significant cough.  This is commonly called Bronchitis and has four common causes:   Allergies Viral Infections Acid Reflux Bacterial Infection Allergies, viruses and acid reflux are treated by controlling symptoms or eliminating the cause. An example might be a cough caused by taking certain blood pressure medications. You stop the cough by changing the medication. Another example might be a cough caused by acid reflux. Controlling the reflux helps control the cough.  USE OF BRONCHODILATOR ("RESCUE") INHALERS: There is a risk from using your bronchodilator too frequently.  The risk is that over-reliance on a medication which only relaxes the muscles surrounding the breathing tubes can reduce the effectiveness of medications prescribed to reduce swelling and congestion of the tubes themselves.  Although you feel brief relief from the bronchodilator inhaler, your  asthma may actually be worsening with the tubes becoming more swollen and filled with mucus.  This can delay other crucial treatments, such as oral steroid medications. If you need to use a bronchodilator inhaler daily, several times per day, you should discuss this with your provider.  There are probably better treatments that could be used to keep your asthma under control.     HOME CARE Only take medications as instructed by your medical team. Complete the entire course of an antibiotic. Drink plenty of fluids and get plenty of rest. Avoid close contacts especially the very young and the elderly Cover your mouth if you cough or cough into your sleeve. Always remember to wash your hands A steam or ultrasonic humidifier can help congestion.   GET HELP RIGHT AWAY IF: You develop worsening fever. You become short of breath You cough up blood. Your symptoms persist after you have completed your treatment plan MAKE SURE YOU  Understand these instructions. Will watch your condition. Will get help right away if you are not doing well or get worse.    Thank you for choosing an e-visit.  Your e-visit answers were reviewed by a board certified advanced clinical practitioner to complete your personal care plan. Depending upon the condition, your plan could have included both over the counter or prescription medications.  Please review your pharmacy choice. Make sure the pharmacy is open so you can pick up  prescription now. If there is a problem, you may contact your provider through [employer] and have the prescription routed to another pharmacy.  Your safety is important to [place]. If you have drug allergies check your prescription carefully.   For the next 24 hours you can use MyChart to ask questions about today's visit, request a non-urgent call back, or ask for a work or school excuse. You will get an email in the next two days asking about your experience. I hope that your e-visit has  been valuable and will speed your recovery.   I spent approximately 5 minutes reviewing the patient's history, current symptoms and coordinating their care today.

## 2023-12-17 ENCOUNTER — Ambulatory Visit (HOSPITAL_COMMUNITY)
Admission: EM | Admit: 2023-12-17 | Discharge: 2023-12-17 | Disposition: A | Discharge status: Home / Self Care | Payer: BLUE CROSS/BLUE SHIELD | Attending: Emergency Medicine | Admitting: Emergency Medicine

## 2023-12-17 ENCOUNTER — Encounter (HOSPITAL_COMMUNITY): Payer: Self-pay

## 2023-12-17 DIAGNOSIS — R051 Acute cough: Secondary | ICD-10-CM

## 2023-12-17 DIAGNOSIS — B349 Viral infection, unspecified: Secondary | ICD-10-CM | POA: Diagnosis not present

## 2023-12-17 LAB — POCT INFLUENZA A/B
Influenza A, POC: NEGATIVE
Influenza B, POC: NEGATIVE

## 2023-12-17 MED ORDER — PROMETHAZINE-DM 6.25-15 MG/5ML PO SYRP: 5.0000 mL | ORAL_SOLUTION | Freq: Every evening | ORAL | 0 refills | Status: DC | PRN

## 2023-12-17 NOTE — ED Provider Notes (Signed)
[facility]    CSN: [id number] Arrival date & time: 12/17/23  1114      History   Chief Complaint Chief Complaint  Patient presents with   Cough    HPI Patricia [name] is a 43 y.o. female.   Patient presents with cough and congestion x 2 days.  Denies fever, shortness of breath, chest pain, headaches, abdominal pain, vomiting, and diarrhea.  Reports taking Tylenol with minimal relief.   Cough Associated symptoms: rhinorrhea   Associated symptoms: no chest pain, no chills, no fever, no headaches and no shortness of breath     Past Medical History:  Diagnosis Date   Migraine    Shingles     Patient Active Problem List   Diagnosis Date Noted   Malnutrition of moderate degree 11/01/2015   Seizure-like activity (HCC) 11/01/2015   Hypoglycemia 10/31/2015   Dysphagia 10/31/2015   Adjustment disorder with mixed anxiety and depressed mood 10/31/2015    Past Surgical History:  Procedure Laterality Date   TUBAL LIGATION      OB History   No obstetric history on file.      Home Medications    Prior to Admission medications   Medication Sig Start Date End Date Taking? Authorizing Provider  promethazine-dextromethorphan (PROMETHAZINE-DM) 6.25-15 MG/5ML syrup Take 5 mLs by mouth at bedtime as needed for cough. 12/17/23  Yes Susann Givens, Effie Wahlert A, NP  dicyclomine (BENTYL) 20 MG tablet Take 1 tablet (20 mg total) by mouth 3 (three) times daily as needed for spasms. Patient not taking: Reported on 09/02/2020 05/10/20 09/02/20  Maia Plan, MD  escitalopram (LEXAPRO) 10 MG tablet Take 1/2 tab po qd x 4-5 days, then increase to 1 tab po qd Patient not taking: Reported on 05/10/2020 01/18/19 09/02/20  Corie Chiquito, PMHNP  propranolol (INDERAL) 10 MG tablet Take 1-2 tabs po BID prn anxiety Patient not taking: Reported on 05/10/2020 01/18/19 09/02/20  Corie Chiquito, PMHNP    Family History Family History  Problem Relation Age of Onset   Hypertension  Other    Anxiety disorder Mother    Bipolar disorder Mother    Anxiety disorder Maternal Grandmother    Depression Maternal Grandmother    Autism Son    Seizures Son    Diabetes Maternal Great-grandmother     Social History Social History   Tobacco Use   Smoking status: Never   Smokeless tobacco: Never  Vaping Use   Vaping status: Never Used  Substance Use Topics   Alcohol use: Not Currently   Drug use: No     Allergies   Hydrocodone   Review of Systems Review of Systems  Constitutional:  Negative for chills, fatigue and fever.  HENT:  Positive for congestion and rhinorrhea.   Respiratory:  Positive for cough. Negative for chest tightness and shortness of breath.   Cardiovascular:  Negative for chest pain.  Gastrointestinal:  Negative for abdominal pain, diarrhea, nausea and vomiting.  Neurological:  Negative for headaches.     Physical Exam Triage Vital Signs ED Triage Vitals  Encounter Vitals Group     BP 12/17/23 1301 136/81     Systolic BP Percentile --      Diastolic BP Percentile --      Pulse Rate 12/17/23 1301 76     Resp 12/17/23 1301 18     Temp 12/17/23 1301 98.6 F (37 C)     Temp Source 12/17/23 1301 Oral     SpO2 12/17/23 1301 98 %  Weight --      Height --      Head Circumference --      Peak Flow --      Pain Score 12/17/23 1302 0     Pain Loc --      Pain Education --      Exclude from Growth Chart --    No data found.  Updated Vital Signs BP 136/81 (BP Location: Left Arm)   Pulse 76   Temp 98.6 F (37 C) (Oral)   Resp 18   LMP 12/02/2023   SpO2 98%   Visual Acuity Right Eye Distance:   Left Eye Distance:   Bilateral Distance:    Right Eye Near:   Left Eye Near:    Bilateral Near:     Physical Exam Vitals and nursing note reviewed.  Constitutional:      General: She is awake. She is not in acute distress.    Appearance: Normal appearance. She is well-developed and well-groomed. She is not ill-appearing.  HENT:      Right Ear: Tympanic membrane, ear canal and external ear normal.     Left Ear: Tympanic membrane, ear canal and external ear normal.     Nose: Congestion and rhinorrhea present.     Mouth/Throat:     Mouth: Mucous membranes are moist.     Pharynx: Posterior oropharyngeal erythema and postnasal drip present. No oropharyngeal exudate.     Tonsils: No tonsillar exudate.  Cardiovascular:     Rate and Rhythm: Normal rate and regular rhythm.  Pulmonary:     Effort: Pulmonary effort is normal.     Breath sounds: Normal breath sounds.  Skin:    General: Skin is warm and dry.  Neurological:     Mental Status: She is alert.  Psychiatric:        Behavior: Behavior is cooperative.      UC Treatments / Results  Labs (all labs ordered are listed, but only abnormal results are displayed) Labs Reviewed  POCT INFLUENZA A/B - Normal    EKG   Radiology No results found.  Procedures Procedures (including critical care time)  Medications Ordered in UC Medications - No data to display  Initial Impression / Assessment and Plan / UC Course  I have reviewed the triage vital signs and the nursing notes.  Pertinent labs & imaging results that were available during my care of the patient were reviewed by me and considered in my medical decision making (see chart for details).     Patient presented with 2-day history of cough and congestion.  Denies any other symptoms.  Patient stated cough keeps her up at night.  Upon assessment congestion and rhinorrhea are present, erythema and postnasal drip noted to pharynx.  Lungs clear bilaterally on auscultation.  Flu testing was negative.  Prescribed promethazine DM for cough at night.  Recommended over-the-counter medications for symptoms.  Discussed return and ER precautions. Final Clinical Impressions(s) / UC Diagnoses   Final diagnoses:  Viral illness  Acute cough     Discharge Instructions      Flu testing was negative.  As  discussed I believe your symptoms are related to a viral illness.  I have prescribed Promethazine DM cough syrup to take at night for cough.  This can make you drowsy so do not work or drive while taking.  I recommend taking Mucinex to help with cough and congestion.  Otherwise alternate between Tylenol and ibuprofen as needed for pain and  fever. Return here if symptoms persist or worsen.  If you develop severe shortness of breath, chest pain, fevers unrelieved by medication please seek immediate medical treatment in the ER.    ED Prescriptions     Medication Sig Dispense Auth. Provider   promethazine-dextromethorphan (PROMETHAZINE-DM) 6.25-15 MG/5ML syrup Take 5 mLs by mouth at bedtime as needed for cough. 118 mL Wynonia Lawman A, NP      PDMP not reviewed this encounter.   Wynonia Lawman A, NP 12/17/23 1404

## 2023-12-17 NOTE — ED Notes (Signed)
Reviewed work note 

## 2023-12-17 NOTE — ED Triage Notes (Signed)
Pt c/o cough, congestion, and rattle in chest x2 days. States cough is worse at night. Took tylenol with little relief.

## 2023-12-17 NOTE — Discharge Instructions (Addendum)
Flu testing was negative.  As discussed I believe your symptoms are related to a viral illness.  I have prescribed Promethazine DM cough syrup to take at night for cough.  This can make you drowsy so do not work or drive while taking.  I recommend taking Mucinex to help with cough and congestion.  Otherwise alternate between Tylenol and ibuprofen as needed for pain and fever. Return here if symptoms persist or worsen.  If you develop severe shortness of breath, chest pain, fevers unrelieved by medication please seek immediate medical treatment in the ER.

## 2024-01-22 ENCOUNTER — Encounter (HOSPITAL_COMMUNITY): Payer: Self-pay

## 2024-01-22 ENCOUNTER — Ambulatory Visit (HOSPITAL_COMMUNITY)
Admission: RE | Admit: 2024-01-22 | Discharge: 2024-01-22 | Disposition: A | Discharge status: Home / Self Care | Payer: BLUE CROSS/BLUE SHIELD | Source: Ambulatory Visit | Attending: Family Medicine | Admitting: Family Medicine

## 2024-01-22 VITALS — BP 153/94 | HR 91 | Temp 99.1°F | Resp 18

## 2024-01-22 DIAGNOSIS — J069 Acute upper respiratory infection, unspecified: Secondary | ICD-10-CM

## 2024-01-22 LAB — POC COVID19/FLU A&B COMBO
Covid Antigen, POC: NEGATIVE
Influenza A Antigen, POC: NEGATIVE
Influenza B Antigen, POC: NEGATIVE

## 2024-01-22 NOTE — ED Triage Notes (Signed)
 Pt had cough, congestion, and rattle in her chest that started 01/12/2024. Those sx have resolved but pt still has loss of taste and loss of smell. Pt needs clearance and a covid test to be cleared for her dentist appointment.

## 2024-01-22 NOTE — Discharge Instructions (Signed)
 The test for flu and COVID was negative today.

## 2024-01-22 NOTE — ED Provider Notes (Signed)
 [facility]    CSN: [id number] Arrival date & time: 01/22/24  1513      History   Chief Complaint No chief complaint on file.   HPI Patricia [name] is a 43 y.o. female.   HPI Here for COVID testing.  February 18 she began having cough and congestion.  Those symptoms have improved and she does has a little cough at night now.  She still does not have much taste or smell.  She is setting up a time for some dental work and they are requesting a COVID test to make sure she does not have that. Past Medical History:  Diagnosis Date   Migraine    Shingles     Patient Active Problem List   Diagnosis Date Noted   Malnutrition of moderate degree 11/01/2015   Seizure-like activity (HCC) 11/01/2015   Hypoglycemia 10/31/2015   Dysphagia 10/31/2015   Adjustment disorder with mixed anxiety and depressed mood 10/31/2015    Past Surgical History:  Procedure Laterality Date   TUBAL LIGATION      OB History   No obstetric history on file.      Home Medications    Prior to Admission medications   Medication Sig Start Date End Date Taking? Authorizing Provider  ipratropium (ATROVENT) 0.03 % nasal spray SMARTSIG:2 Spray(s) Both Nares Every 12 Hours 12/24/23  Yes [provider]  dicyclomine (BENTYL) 20 MG tablet Take 1 tablet (20 mg total) by mouth 3 (three) times daily as needed for spasms. Patient not taking: Reported on 09/02/2020 05/10/20 09/02/20  Maia Plan, MD  escitalopram (LEXAPRO) 10 MG tablet Take 1/2 tab po qd x 4-5 days, then increase to 1 tab po qd Patient not taking: Reported on 05/10/2020 01/18/19 09/02/20  Corie Chiquito, PMHNP  propranolol (INDERAL) 10 MG tablet Take 1-2 tabs po BID prn anxiety Patient not taking: Reported on 05/10/2020 01/18/19 09/02/20  Corie Chiquito, PMHNP    Family History Family History  Problem Relation Age of Onset   Hypertension Other    Anxiety disorder Mother    Bipolar disorder Mother    Anxiety  disorder Maternal Grandmother    Depression Maternal Grandmother    Autism Son    Seizures Son    Diabetes Maternal Great-grandmother     Social History Social History   Tobacco Use   Smoking status: Never   Smokeless tobacco: Never  Vaping Use   Vaping status: Never Used  Substance Use Topics   Alcohol use: Not Currently   Drug use: No     Allergies   Hydrocodone   Review of Systems Review of Systems   Physical Exam Triage Vital Signs ED Triage Vitals  Encounter Vitals Group     BP 01/22/24 1538 (!) 153/94     Systolic BP Percentile --      Diastolic BP Percentile --      Pulse Rate 01/22/24 1538 91     Resp 01/22/24 1538 18     Temp 01/22/24 1538 99.1 F (37.3 C)     Temp Source 01/22/24 1538 Oral     SpO2 01/22/24 1538 97 %     Weight --      Height --      Head Circumference --      Peak Flow --      Pain Score 01/22/24 1535 0     Pain Loc --      Pain Education --  Exclude from Growth Chart --    No data found.  Updated Vital Signs BP (!) 153/94 (BP Location: Left Arm)   Pulse 91   Temp 99.1 F (37.3 C) (Oral)   Resp 18   LMP 01/06/2024 (Approximate)   SpO2 97%   Visual Acuity Right Eye Distance:   Left Eye Distance:   Bilateral Distance:    Right Eye Near:   Left Eye Near:    Bilateral Near:     Physical Exam Vitals reviewed.  Constitutional:      General: She is not in acute distress.    Appearance: She is not toxic-appearing.  HENT:     Nose: Nose normal.     Mouth/Throat:     Mouth: Mucous membranes are moist.     Pharynx: No oropharyngeal exudate or posterior oropharyngeal erythema.  Eyes:     Extraocular Movements: Extraocular movements intact.     Conjunctiva/sclera: Conjunctivae normal.     Pupils: Pupils are equal, round, and reactive to light.  Cardiovascular:     Rate and Rhythm: Normal rate and regular rhythm.     Heart sounds: No murmur heard. Pulmonary:     Effort: Pulmonary effort is normal. No  respiratory distress.     Breath sounds: No stridor. No wheezing, rhonchi or rales.  Musculoskeletal:     Cervical back: Neck supple.  Lymphadenopathy:     Cervical: No cervical adenopathy.  Skin:    Capillary Refill: Capillary refill takes less than 2 seconds.     Coloration: Skin is not jaundiced or pale.  Neurological:     General: No focal deficit present.     Mental Status: She is alert and oriented to person, place, and time.  Psychiatric:        Behavior: Behavior normal.      UC Treatments / Results  Labs (all labs ordered are listed, but only abnormal results are displayed) Labs Reviewed  POC COVID19/FLU A&B COMBO - Normal    EKG   Radiology No results found.  Procedures Procedures (including critical care time)  Medications Ordered in UC Medications - No data to display  Initial Impression / Assessment and Plan / UC Course  I have reviewed the triage vital signs and the nursing notes.  Pertinent labs & imaging results that were available during my care of the patient were reviewed by me and considered in my medical decision making (see chart for details).     Flu and COVID test is negative. Final Clinical Impressions(s) / UC Diagnoses   Final diagnoses:  Acute upper respiratory infection     Discharge Instructions      The test for flu and COVID was negative today.     ED Prescriptions   None    PDMP not reviewed this encounter.   Zenia Resides, MD 01/22/24 [phone]

## 2024-02-29 ENCOUNTER — Ambulatory Visit
Admission: EM | Admit: 2024-02-29 | Discharge: 2024-02-29 | Disposition: A | Discharge status: Home / Self Care | Attending: Nurse Practitioner | Admitting: Nurse Practitioner

## 2024-02-29 DIAGNOSIS — B349 Viral infection, unspecified: Secondary | ICD-10-CM | POA: Diagnosis not present

## 2024-02-29 LAB — POC SARS CORONAVIRUS 2 AG -  ED: SARS Coronavirus 2 Ag: NEGATIVE

## 2024-02-29 NOTE — ED Notes (Signed)
 POC COVID test Negative

## 2024-02-29 NOTE — ED Triage Notes (Signed)
 Pt c/o cough, congestion, and headache for 2 days

## 2024-02-29 NOTE — ED Provider Notes (Signed)
 [name]    CSN: [id number] Arrival date & time: 02/29/24  1426      History   Chief Complaint Chief Complaint  Patient presents with   Cough    HPI [name] Patricia [name] is a 43 y.o. female.   Subjective:   Patricia [name] Patricia [name] is a 43 year old female who presents with symptoms consistent with an upper respiratory infection. She reports a headache, loss of appetite, runny nose, and fatigue, stating she feels generally "drained" and that her sense of taste is "off." She notes a cough that occurs only at night. She denies any fever, chills, body aches, sore throat, nasal congestion, nausea, vomiting, or diarrhea. Symptoms began a few days ago and have been persistent. She has not taken any medications for relief but is staying well-hydrated. Notably, her daughter has been ill with similar symptoms and is also being evaluated today.  The following portions of the patient's history were reviewed and updated as appropriate: allergies, current medications, past family history, past medical history, past social history, past surgical history, and problem list.      Past Medical History:  Diagnosis Date   Migraine    Shingles     Patient Active Problem List   Diagnosis Date Noted   Malnutrition of moderate degree 11/01/2015   Seizure-like activity (HCC) 11/01/2015   Hypoglycemia 10/31/2015   Dysphagia 10/31/2015   Adjustment disorder with mixed anxiety and depressed mood 10/31/2015    Past Surgical History:  Procedure Laterality Date   TUBAL LIGATION      OB History   No obstetric history on file.      Home Medications    Prior to Admission medications   Medication Sig Start Date End Date Taking? Authorizing Provider  ipratropium (ATROVENT) 0.03 % nasal spray SMARTSIG:2 Spray(s) Both Nares Every 12 Hours 12/24/23   [provider]  dicyclomine (BENTYL) 20 MG tablet Take 1 tablet (20 mg total) by mouth 3 (three) times daily as needed  for spasms. Patient not taking: Reported on 09/02/2020 05/10/20 09/02/20  Maia Plan, MD  escitalopram (LEXAPRO) 10 MG tablet Take 1/2 tab po qd x 4-5 days, then increase to 1 tab po qd Patient not taking: Reported on 05/10/2020 01/18/19 09/02/20  Corie Chiquito, PMHNP  propranolol (INDERAL) 10 MG tablet Take 1-2 tabs po BID prn anxiety Patient not taking: Reported on 05/10/2020 01/18/19 09/02/20  Corie Chiquito, PMHNP    Family History Family History  Problem Relation Age of Onset   Hypertension Other    Anxiety disorder Mother    Bipolar disorder Mother    Anxiety disorder Maternal Grandmother    Depression Maternal Grandmother    Autism Son    Seizures Son    Diabetes Maternal Great-grandmother     Social History Social History   Tobacco Use   Smoking status: Never   Smokeless tobacco: Never  Vaping Use   Vaping status: Never Used  Substance Use Topics   Alcohol use: Not Currently   Drug use: No     Allergies   Hydrocodone   Review of Systems Review of Systems  Constitutional:  Positive for appetite change and fatigue. Negative for chills, diaphoresis and fever.  HENT:  Positive for rhinorrhea. Negative for congestion, sneezing and sore throat.   Gastrointestinal:  Negative for diarrhea, nausea and vomiting.  Musculoskeletal:  Negative for myalgias.  Neurological:  Positive for headaches.  All other systems reviewed and are negative.    Physical Exam  Triage Vital Signs ED Triage Vitals  Encounter Vitals Group     BP 02/29/24 1443 127/84     Systolic BP Percentile --      Diastolic BP Percentile --      Pulse Rate 02/29/24 1443 85     Resp 02/29/24 1443 17     Temp 02/29/24 1443 98.1 F (36.7 C)     Temp Source 02/29/24 1443 Oral     SpO2 02/29/24 1443 97 %     Weight --      Height --      Head Circumference --      Peak Flow --      Pain Score 02/29/24 1440 6     Pain Loc --      Pain Education --      Exclude from Growth Chart --    No  data found.  Updated Vital Signs BP 127/84 (BP Location: Right Arm)   Pulse 85   Temp 98.1 F (36.7 C) (Oral)   Resp 17   LMP 02/09/2024 (Approximate)   SpO2 97%   Visual Acuity Right Eye Distance:   Left Eye Distance:   Bilateral Distance:    Right Eye Near:   Left Eye Near:    Bilateral Near:     Physical Exam Vitals reviewed.  Constitutional:      General: She is not in acute distress.    Appearance: Normal appearance. She is normal weight. She is not ill-appearing or toxic-appearing.  HENT:     Head: Normocephalic.     Nose: Nose normal.     Mouth/Throat:     Mouth: Mucous membranes are moist.     Pharynx: Oropharynx is clear.  Cardiovascular:     Rate and Rhythm: Normal rate.  Pulmonary:     Effort: Pulmonary effort is normal.     Breath sounds: Normal breath sounds.  Musculoskeletal:        General: Normal range of motion.     Cervical back: Normal range of motion and neck supple.  Lymphadenopathy:     Cervical: No cervical adenopathy.  Skin:    General: Skin is warm and dry.  Neurological:     General: No focal deficit present.     Mental Status: She is alert and oriented to person, place, and time.      UC Treatments / Results  Labs (all labs ordered are listed, but only abnormal results are displayed) Labs Reviewed  POC SARS CORONAVIRUS 2 AG -  ED    EKG   Radiology No results found.  Procedures Procedures (including critical care time)  Medications Ordered in UC Medications - No data to display  Initial Impression / Assessment and Plan / UC Course  I have reviewed the triage vital signs and the nursing notes.  Pertinent labs & imaging results that were available during my care of the patient were reviewed by me and considered in my medical decision making (see chart for details).    43 year old female presenting with a 2-day history of headache, loss of appetite, runny nose, fatigue, and nighttime cough. Her daughter has had similar  symptoms and is also being evaluated today. Patient is afebrile and nontoxic. Physical exam as above. COVID test is negative for both patient and daughter. Presentation is most consistent with an acute viral illness. Supportive care recommended including rest, fluids, and symptomatic relief. Advised to follow up with primary care provider if symptoms worsen or fail to improve.  Today's evaluation has revealed no signs of a dangerous process. Discussed diagnosis with patient and/or guardian. Patient and/or guardian aware of their diagnosis, possible red flag symptoms to watch out for and need for close follow up. Patient and/or guardian understands verbal and written discharge instructions. Patient and/or guardian comfortable with plan and disposition.  Patient and/or guardian has a clear mental status at this time, good insight into illness (after discussion and teaching) and has clear judgment to make decisions regarding their care  Documentation was completed with the aid of voice recognition software. Transcription may contain typographical errors. Final Clinical Impressions(s) / UC Diagnoses   Final diagnoses:  Viral illness     Discharge Instructions      Your symptoms are most likely caused by a viral illness. Viruses can make you feel sick by entering your body's cells, multiplying, and causing those cells to stop working properly or die. As the virus spreads, you start to feel symptoms like a cough, congestion, or body aches. Since your illness is caused by a virus, antibiotics won't help because they only treat infections caused by bacteria.  The best way to feel better is to rest and take care of your body. You can use over-the-counter medicines to help manage symptoms like a cough, stuffy nose, or upset stomach. If you have a fever, headache, or muscle aches, taking Tylenol or ibuprofen can help you feel more comfortable. Make sure you're drinking plenty of fluids--enough to keep your  urine a light yellow color--to stay well hydrated while your body fights off the virus.  If your symptoms get worse or if you develop any new or concerning symptoms, go to the emergency room right away. If you're not feeling better in a few days, follow up with your primary care provider.     ED Prescriptions   None    PDMP not reviewed this encounter.   Lurline Idol, [place] 02/29/24 [phone]

## 2024-02-29 NOTE — Discharge Instructions (Addendum)
 Your symptoms are most likely caused by a viral illness. Viruses can make you feel sick by entering your body's cells, multiplying, and causing those cells to stop working properly or die. As the virus spreads, you start to feel symptoms like a cough, congestion, or body aches. Since your illness is caused by a virus, antibiotics won't help because they only treat infections caused by bacteria.  The best way to feel better is to rest and take care of your body. You can use over-the-counter medicines to help manage symptoms like a cough, stuffy nose, or upset stomach. If you have a fever, headache, or muscle aches, taking Tylenol or ibuprofen can help you feel more comfortable. Make sure you're drinking plenty of fluids--enough to keep your urine a light yellow color--to stay well hydrated while your body fights off the virus.  If your symptoms get worse or if you develop any new or concerning symptoms, go to the emergency room right away. If you're not feeling better in a few days, follow up with your primary care provider.

## 2024-04-27 ENCOUNTER — Emergency Department (HOSPITAL_COMMUNITY)
Admission: EM | Admit: 2024-04-27 | Discharge: 2024-04-27 | Disposition: A | Discharge status: Home / Self Care | Attending: Emergency Medicine | Admitting: Emergency Medicine

## 2024-04-27 ENCOUNTER — Encounter (HOSPITAL_COMMUNITY): Payer: Self-pay

## 2024-04-27 ENCOUNTER — Emergency Department (HOSPITAL_COMMUNITY)

## 2024-04-27 DIAGNOSIS — R059 Cough, unspecified: Secondary | ICD-10-CM | POA: Diagnosis present

## 2024-04-27 DIAGNOSIS — J069 Acute upper respiratory infection, unspecified: Secondary | ICD-10-CM | POA: Insufficient documentation

## 2024-04-27 HISTORY — DX: Other seasonal allergic rhinitis: J30.2

## 2024-04-27 LAB — RESP PANEL BY RT-PCR (RSV, FLU A&B, COVID)  RVPGX2
Influenza A by PCR: NEGATIVE
Influenza B by PCR: NEGATIVE
Resp Syncytial Virus by PCR: NEGATIVE
SARS Coronavirus 2 by RT PCR: NEGATIVE

## 2024-04-27 MED ORDER — KETOROLAC TROMETHAMINE 15 MG/ML IJ SOLN
15.0000 mg | Freq: Once | INTRAMUSCULAR | Status: AC
  Administered 2024-04-27: 15 mg via INTRAMUSCULAR
  Filled 2024-04-27: qty 1

## 2024-04-27 NOTE — Discharge Instructions (Addendum)
 It was a pleasure caring for you today.  Please follow-up with your primary care provider in the next 48 to 72 hours.  Seek emergency care if experiencing any new or worsening symptoms.  XRAY IMPRESSION: No acute cardiopulmonary disease.

## 2024-04-27 NOTE — ED Provider Notes (Signed)
 [place] EMERGENCY DEPARTMENT AT [facility] Provider Note   CSN: [id number] Arrival date & time: 04/27/24  1205     History  Chief Complaint  Patient presents with   Cough   Chest Pain   Nasal Congestion    Patricia [name] Thurnell Floss is a 43 y.o. female with PMHx migraines who presents to ED for congestion, rhinorrhea, dry cough, and central chest pain x2 days. Patient has been taking benadryl  and nasal sprays for the symptoms because she initially thought that it was d/t her seasonal allergies. Patient stating that chest pain worsens with deep inspiration and during coughing fits.  Denies fever, dyspnea, nausea, vomiting, diarrhea, dysuria, hematuria, hematochezia.    Cough Associated symptoms: chest pain   Chest Pain Associated symptoms: cough        Home Medications Prior to Admission medications   Medication Sig Start Date End Date Taking? Authorizing Provider  ipratropium (ATROVENT ) 0.03 % nasal spray SMARTSIG:2 Spray(s) Both Nares Every 12 Hours 12/24/23   [provider]  dicyclomine  (BENTYL ) 20 MG tablet Take 1 tablet (20 mg total) by mouth 3 (three) times daily as needed for spasms. Patient not taking: Reported on 09/02/2020 05/10/20 09/02/20  Roberts Ching, MD  escitalopram  (LEXAPRO ) 10 MG tablet Take 1/2 tab po qd x 4-5 days, then increase to 1 tab po qd Patient not taking: Reported on 05/10/2020 01/18/19 09/02/20  Selinda Dales, PMHNP  propranolol  (INDERAL ) 10 MG tablet Take 1-2 tabs po BID prn anxiety Patient not taking: Reported on 05/10/2020 01/18/19 09/02/20  Selinda Dales, PMHNP      Allergies    Hydrocodone    Review of Systems   Review of Systems  Respiratory:  Positive for cough.   Cardiovascular:  Positive for chest pain.    Physical Exam Updated Vital Signs BP (!) 140/91 (BP Location: Left Arm)   Pulse (!) 114   Temp 99.5 F (37.5 C) (Oral)   Resp 16   Ht 5\' 1"  (1.549 m)   Wt 69.9 kg   LMP  (Approximate)   SpO2  99%   BMI 29.10 kg/m  Physical Exam Vitals and nursing note reviewed.  Constitutional:      General: She is not in acute distress.    Appearance: She is not ill-appearing, toxic-appearing or diaphoretic.  HENT:     Head: Normocephalic and atraumatic.     Mouth/Throat:     Mouth: Mucous membranes are moist.     Pharynx: No oropharyngeal exudate or posterior oropharyngeal erythema.  Eyes:     General: No scleral icterus.       Right eye: No discharge.        Left eye: No discharge.     Conjunctiva/sclera: Conjunctivae normal.  Cardiovascular:     Rate and Rhythm: Normal rate and regular rhythm.     Pulses: Normal pulses.     Heart sounds: Normal heart sounds. No murmur heard. Pulmonary:     Effort: Pulmonary effort is normal. No respiratory distress.     Breath sounds: Normal breath sounds. No wheezing, rhonchi or rales.  Abdominal:     Tenderness: There is no abdominal tenderness.  Musculoskeletal:     Right lower leg: No edema.     Left lower leg: No edema.  Skin:    General: Skin is warm and dry.     Findings: No rash.  Neurological:     General: No focal deficit present.     Mental  Status: She is alert and oriented to person, place, and time. Mental status is at baseline.  Psychiatric:        Mood and Affect: Mood normal.        Behavior: Behavior normal.     ED Results / Procedures / Treatments   Labs (all labs ordered are listed, but only abnormal results are displayed) Labs Reviewed  RESP PANEL BY RT-PCR (RSV, FLU A&B, COVID)  [id number]    EKG EKG Interpretation Date/Time:  Wednesday April 27 2024 12:11:23 EDT Ventricular Rate:  115 PR Interval:  138 QRS Duration:  81 QT Interval:  297 QTC Calculation: 411 R Axis:   71  Text Interpretation: Sinus tachycardia Biatrial enlargement Low voltage, precordial leads Probable anteroseptal infarct, old Confirmed by Russella Courts ([id number]) on 04/27/2024 1:02:13 PM  Radiology DG Chest 2 View Result Date:  04/27/2024 CLINICAL DATA:  SOB and chest pain EXAM: CHEST - 2 VIEW COMPARISON:  09/02/2020 FINDINGS: Lungs are clear.  No pneumothorax. Heart size and mediastinal contours are within normal limits. No effusion. Visualized bones unremarkable. IMPRESSION: No acute cardiopulmonary disease. Electronically Signed   By: Nicoletta Barrier M.D.   On: 04/27/2024 13:20    Procedures Procedures    Medications Ordered in ED Medications  ketorolac  (TORADOL ) 15 MG/ML injection 15 mg (15 mg Intramuscular Given 04/27/24 1259)    ED Course/ Medical Decision Making/ A&P                                 Medical Decision Making Amount and/or Complexity of Data Reviewed Radiology: ordered.  Risk Prescription drug management.    This patient presents to the ED for concern of nasal congestion, cough, chest pain, this involves an extensive number of treatment options, and is a complaint that carries with it a high risk of complications and morbidity.  The differential diagnosis includes Flu/COVID/RSV, strep pharyngitis, sinusitis, peritonsillar abscess, retropharyngeal abscess, pneumonia, meningitis.   Co morbidities that complicate the patient evaluation  migraine   Additional history obtained:  PCP with [facility]   Problem List / ED Course / Critical interventions / Medication management  Patient presents to ED concern for rhinorrhea, congestion, cough.  Patient also endorsing chest pain that worsens with coughing and deep inspirations. Denies dyspnea. Patient with tachycardia around 114 on arrival which resolved to the 90s with oral rehydration.  Patient also with low-grade fever at 99.3F.  Rest of physical exam reassuring. Offered patient Toradol  shot which she accepted. Patient starting to feel better after Toradol  shot.  I Ordered, and personally interpreted labs.  Resp panel negative.  EKG sinus rhythm. I ordered imaging studies including chest xray to assess for process contributing to  patient's symptoms. I independently visualized and interpreted imaging which showed no acute cardiopulmonary disease. I agree with the radiologist interpretation. Shared also with the patient.  Answered all questions.  Offered patient further workup for her chest pain to ensure that is not a cardiac cause.  Patient declining further workup stating that she does not think it is her heart causing her symptoms since it started around the same time as the coughing and rhinorrhea/congestion. Patient stating that she would prefer to follow up with PCP but does agree to come back to ED for new or worsening symptoms. Offered patient another opportunity to obtain blood workup - patient still declining stating that she is ready to go home.  Patient was educated  on alternating between 650 mg Tylenol  and 400 mg ibuprofen  every 3 hours as needed for pain.  Recommended following up with PCP. I have reviewed the patients home medicines and have made adjustments as needed The patient has been appropriately medically screened and/or stabilized in the ED. I have low suspicion for any other emergent medical condition which would require further screening, evaluation or treatment in the ED or require inpatient management. At time of discharge the patient is hemodynamically stable and in no acute distress. I have discussed work-up results and diagnosis with patient and answered all questions. Patient is agreeable with discharge plan. We discussed strict return precautions for returning to the emergency department and they verbalized understanding.     Social Determinants of Health:  none         Final Clinical Impression(s) / ED Diagnoses Final diagnoses:  Viral URI with cough    Rx / DC Orders ED Discharge Orders     None         [place], [place] 04/27/24 1422    Teddi Favors, DO 04/28/24 [phone]

## 2024-04-27 NOTE — ED Triage Notes (Signed)
 Pt c/o nasal and chest congestion, cough, and central chest pain x2 days.  Pain score 7/10.  Pt reports using OTC medications w/o relief.
